# Patient Record
Sex: Female | Born: 1960 | Race: White | Hispanic: No | Marital: Married | Smoking: Never smoker
Health system: Southern US, Community
[De-identification: ages and names within clinical notes are randomized; demographics above are authoritative.]

## PROBLEM LIST (undated history)

## (undated) DIAGNOSIS — J45909 Unspecified asthma, uncomplicated: Secondary | ICD-10-CM

## (undated) DIAGNOSIS — M1990 Unspecified osteoarthritis, unspecified site: Secondary | ICD-10-CM

## (undated) DIAGNOSIS — M8580 Other specified disorders of bone density and structure, unspecified site: Secondary | ICD-10-CM

## (undated) DIAGNOSIS — R635 Abnormal weight gain: Secondary | ICD-10-CM

## (undated) DIAGNOSIS — E039 Hypothyroidism, unspecified: Secondary | ICD-10-CM

## (undated) DIAGNOSIS — M7989 Other specified soft tissue disorders: Secondary | ICD-10-CM

## (undated) DIAGNOSIS — J302 Other seasonal allergic rhinitis: Secondary | ICD-10-CM

## (undated) DIAGNOSIS — R600 Localized edema: Secondary | ICD-10-CM

## (undated) DIAGNOSIS — M549 Dorsalgia, unspecified: Secondary | ICD-10-CM

## (undated) DIAGNOSIS — E079 Disorder of thyroid, unspecified: Secondary | ICD-10-CM

## (undated) DIAGNOSIS — K219 Gastro-esophageal reflux disease without esophagitis: Secondary | ICD-10-CM

## (undated) DIAGNOSIS — R1310 Dysphagia, unspecified: Secondary | ICD-10-CM

## (undated) HISTORY — DX: Abnormal weight gain: R63.5

## (undated) HISTORY — DX: Other specified disorders of bone density and structure, unspecified site: M85.80

## (undated) HISTORY — DX: Dorsalgia, unspecified: M54.9

## (undated) HISTORY — PX: TONSILLECTOMY: SUR1361

## (undated) HISTORY — DX: Disorder of thyroid, unspecified: E07.9

## (undated) HISTORY — DX: Dysphagia, unspecified: R13.10

## (undated) HISTORY — DX: Localized edema: R60.0

## (undated) HISTORY — DX: Other specified soft tissue disorders: M79.89

## (undated) HISTORY — PX: OTHER SURGICAL HISTORY: SHX169

## (undated) HISTORY — PX: TENDON RECONSTRUCTION: SHX2487

---

## 2008-05-10 ENCOUNTER — Ambulatory Visit: Payer: Self-pay | Admitting: Sports Medicine

## 2008-05-10 ENCOUNTER — Ambulatory Visit: Payer: Self-pay | Admitting: Family Medicine

## 2008-05-10 DIAGNOSIS — Q666 Other congenital valgus deformities of feet: Secondary | ICD-10-CM | POA: Insufficient documentation

## 2008-05-10 DIAGNOSIS — M79609 Pain in unspecified limb: Secondary | ICD-10-CM | POA: Insufficient documentation

## 2008-05-10 DIAGNOSIS — S90129A Contusion of unspecified lesser toe(s) without damage to nail, initial encounter: Secondary | ICD-10-CM | POA: Insufficient documentation

## 2008-06-07 ENCOUNTER — Encounter: Payer: Self-pay | Admitting: Family Medicine

## 2010-10-02 ENCOUNTER — Encounter: Payer: Self-pay | Admitting: Family Medicine

## 2010-10-02 ENCOUNTER — Ambulatory Visit: Admission: RE | Admit: 2010-10-02 | Discharge: 2010-10-02 | Payer: Self-pay | Source: Home / Self Care

## 2010-10-02 DIAGNOSIS — R635 Abnormal weight gain: Secondary | ICD-10-CM | POA: Insufficient documentation

## 2010-10-02 DIAGNOSIS — E039 Hypothyroidism, unspecified: Secondary | ICD-10-CM | POA: Insufficient documentation

## 2010-10-02 LAB — CONVERTED CEMR LAB
ALT: 15 units/L (ref 0–35)
Albumin: 4.5 g/dL (ref 3.5–5.2)
CO2: 27 meq/L (ref 19–32)
Calcium: 9.2 mg/dL (ref 8.4–10.5)
Chloride: 102 meq/L (ref 96–112)
MCV: 91.5 fL (ref 78.0–100.0)
Platelets: 266 10*3/uL (ref 150–400)
Potassium: 4 meq/L (ref 3.5–5.3)
Sodium: 139 meq/L (ref 135–145)
T3, Free: 2.6 pg/mL (ref 2.3–4.2)
TSH: 3.228 microintl units/mL (ref 0.350–4.500)
Total Protein: 6.9 g/dL (ref 6.0–8.3)
WBC: 9.8 10*3/uL (ref 4.0–10.5)

## 2010-10-03 ENCOUNTER — Encounter: Payer: Self-pay | Admitting: Family Medicine

## 2010-10-03 LAB — CONVERTED CEMR LAB
Cholesterol: 214 mg/dL — ABNORMAL HIGH (ref 0–200)
VLDL: 17 mg/dL (ref 0–40)

## 2010-10-12 NOTE — Assessment & Plan Note (Signed)
Summary: thyroid followup   Vital Signs:  Patient profile:   50 year old female Height:      64 inches Weight:      156.5 pounds BMI:     26.96 Pulse rate:   71 / minute BP sitting:   118 / 77  (right arm)  Vitals Entered By: Arlyss Repress CMA, (October 02, 2010 1:22 PM) CC: discuss weight gain over last year and thyroid problems. pt takes levothyroxine x 2 years. feels like it is not the correct dose. Is Patient Diabetic? No Pain Assessment Patient in pain? no        CC:  discuss weight gain over last year and thyroid problems. pt takes levothyroxine x 2 years. feels like it is not the correct dose.Marland Kitchen  History of Present Illness: Doris Aguilar is here today to establish care at [facility] as a new patient.  She gives a history of the diagnosis of symptomatic hypothyroidism in 2007, with significant reponse to replacement.  At that time she also was making changes in her life syle, with increased exercise and she lost 60 pounds.  She felt well.  Her replacement dose was around 120 micrograms daily.  With a change in primary MD because of retirement, and subsequent blood work her replacement dose was decreased to 100 micrograms a year ago.  Over the past year she has gained 20 pounds despite aggressive exercises and healthy diet.  Additionally she reports consiptation, cold intolerance, cracking nails, dry hair and increased fatigue.  She is perimenopausal with scant infrequent menses and hot flashes.  She uses [employer] and finds that it reduces the hot flashes.    She reports lipid panel with high HDL, no known anemia, no episodes of tachycardia.   Habits & Providers  Alcohol-Tobacco-Diet     Tobacco Status: never  Current Medications (verified): 1)  Triamcinolone Acetonide 0.1 % Crea (Triamcinolone Acetonide) .... Apply To Rash As Directed, 80 Gm Tube 2)  Black Cohosh Hot Flash Relief 40 Mg Caps (Black Cohosh) 3)  Levothroid 100 Mcg Tabs (Levothyroxine Sodium) 4)  Fish Oil  1000 Mg Caps (Omega-3 Fatty Acids)  Allergies (verified): 1)  ! Penicillin V Potassium (Penicillin V Potassium)  Social History: Smoking Status:  never  Review of Systems       The patient complains of weight gain.  The patient denies anorexia, chest pain, dyspnea on exertion, and peripheral edema.   CV:  Complains of chest pain or discomfort and palpitations. Endo:  Complains of cold intolerance, heat intolerance, and weight change.  Physical Exam  General:  Well-developed,well-nourished,in no acute distress; alert,appropriate and cooperative throughout examination Neck:  No deformities, thyroidmegly, adenopathy, or tenderness noted. Heart:  normal rate, regular rhythm, and no murmur.   Skin:  turgor normal and color normal.  Nails: thumb nails dry with linear markings Cervical Nodes:  No lymphadenopathy noted Psych:  normally interactive, good eye contact, and not anxious appearing.     Impression & Recommendations:  Problem # 1:  HYPOTHYROIDISM (ICD-244.9) Recheck thyroid function, consider dosage increase if elevated or normal. Her updated medication list for this problem includes:    Levothroid 100 Mcg Tabs (Levothyroxine sodium)  Orders: CBC-[facility] (78295) Comp Met-[facility] ([id number])  Problem # 2:  WEIGHT GAIN (ICD-783.1) despite increase in physical activity and changes in diet, plan to see Dr. Gerilyn Pilgrim for counseling in near future  Complete Medication List: 1)  Triamcinolone Acetonide 0.1 % Crea (Triamcinolone acetonide) .... Apply to rash as directed,  80 gm tube 2)  Black Cohosh Hot Flash Relief 40 Mg Caps (Black cohosh) 3)  Levothroid 100 Mcg Tabs (Levothyroxine sodium) 4)  Fish Oil 1000 Mg Caps (Omega-3 fatty acids)  Other Orders: Lipid-[facility] ([id number]) Free T4-[facility] [phone]) Free T3-[facility] [phone])  Patient Instructions: 1)  Call with [phone] 2)  Carb reduction   Orders Added: 1)  Lipid-[facility] [80061-22930] 2)  Free T4-[facility] [44010-27253] 3)  Free  T3-[facility] [66440-34742] 4)  CBC-[facility] [85027] 5)  Comp Met-[facility] [80053-22900] 6)  [facility] [99203]

## 2010-10-12 NOTE — Miscellaneous (Signed)
  Clinical Lists Changes adjust up for high normal TSH, low normal active T3, and symptoms consistent with hypothyroidism. Medications: Changed medication from LEVOTHROID 100 MCG TABS (LEVOTHYROXINE SODIUM) to LEVOTHROID 125 MCG TABS (LEVOTHYROXINE SODIUM) one daily - Signed Rx of LEVOTHROID 125 MCG TABS (LEVOTHYROXINE SODIUM) one daily;  #31 x 3;  Signed;  Entered by: Luretha Murphy NP;  Authorized by: Luretha Murphy NP;  Method used: [facility] [phone]*, [address] [place], [place], [place]  [address], Ph: [phone] or [phone], Fax: [phone]    Prescriptions: LEVOTHROID 125 MCG TABS (LEVOTHYROXINE SODIUM) one daily  #31 x 3   Entered and Authorized by:   Luretha Murphy NP   Signed by:   Luretha Murphy NP on 10/03/2010   Method used:   [facility]ronically to        [facility] [phone]* (retail)       [address]       [place], [place]  [address]       Ph: [phone] or [phone]       Fax: [phone]   RxID:   [id number]

## 2010-12-25 ENCOUNTER — Other Ambulatory Visit: Payer: 59

## 2010-12-25 DIAGNOSIS — E039 Hypothyroidism, unspecified: Secondary | ICD-10-CM

## 2010-12-25 NOTE — Progress Notes (Signed)
TSH DONE TODAY MARCI HOLDER 

## 2010-12-26 ENCOUNTER — Other Ambulatory Visit: Payer: Self-pay | Admitting: Family Medicine

## 2010-12-26 DIAGNOSIS — E039 Hypothyroidism, unspecified: Secondary | ICD-10-CM

## 2010-12-27 ENCOUNTER — Other Ambulatory Visit: Payer: Self-pay | Admitting: Family Medicine

## 2010-12-27 ENCOUNTER — Encounter: Payer: Self-pay | Admitting: *Deleted

## 2010-12-27 DIAGNOSIS — Z1231 Encounter for screening mammogram for malignant neoplasm of breast: Secondary | ICD-10-CM

## 2010-12-27 MED ORDER — LEVOTHYROXINE SODIUM 112 MCG PO TABS: 112.0000 ug | ORAL_TABLET | Freq: Every day | ORAL | Status: DC

## 2010-12-27 NOTE — Progress Notes (Signed)
Called the breast center to schedule dexa and screening mammogram. Was told they have already called and left message for patient to return call so they can schedule appointments.Busick, Rodena Medin

## 2010-12-28 ENCOUNTER — Other Ambulatory Visit: Payer: Self-pay | Admitting: Family Medicine

## 2010-12-28 NOTE — Progress Notes (Signed)
Selected pharmacy.

## 2011-01-20 ENCOUNTER — Other Ambulatory Visit: Payer: Self-pay | Admitting: Family Medicine

## 2011-01-20 NOTE — Telephone Encounter (Signed)
Refill request

## 2011-02-07 ENCOUNTER — Other Ambulatory Visit: Payer: Self-pay | Admitting: Family Medicine

## 2011-02-07 DIAGNOSIS — E039 Hypothyroidism, unspecified: Secondary | ICD-10-CM

## 2011-02-08 NOTE — Telephone Encounter (Signed)
Refill request

## 2011-02-12 ENCOUNTER — Ambulatory Visit
Admission: RE | Admit: 2011-02-12 | Discharge: 2011-02-12 | Disposition: A | Discharge status: Home / Self Care | Payer: 59 | Source: Ambulatory Visit | Attending: Family Medicine | Admitting: Family Medicine

## 2011-02-12 ENCOUNTER — Encounter: Payer: 59 | Admitting: Family Medicine

## 2011-02-12 ENCOUNTER — Other Ambulatory Visit: Payer: Self-pay | Admitting: Family Medicine

## 2011-02-12 DIAGNOSIS — Z8262 Family history of osteoporosis: Secondary | ICD-10-CM

## 2011-02-12 DIAGNOSIS — M8580 Other specified disorders of bone density and structure, unspecified site: Secondary | ICD-10-CM

## 2011-02-12 DIAGNOSIS — E039 Hypothyroidism, unspecified: Secondary | ICD-10-CM

## 2011-02-12 DIAGNOSIS — Z0000 Encounter for general adult medical examination without abnormal findings: Secondary | ICD-10-CM

## 2011-02-12 DIAGNOSIS — Z1231 Encounter for screening mammogram for malignant neoplasm of breast: Secondary | ICD-10-CM

## 2011-02-12 HISTORY — DX: Other specified disorders of bone density and structure, unspecified site: M85.80

## 2011-02-13 ENCOUNTER — Other Ambulatory Visit: Payer: Self-pay | Admitting: Family Medicine

## 2011-02-13 ENCOUNTER — Encounter: Payer: Self-pay | Admitting: Family Medicine

## 2011-02-13 DIAGNOSIS — E038 Other specified hypothyroidism: Secondary | ICD-10-CM

## 2011-02-13 DIAGNOSIS — E039 Hypothyroidism, unspecified: Secondary | ICD-10-CM

## 2011-02-13 DIAGNOSIS — M8580 Other specified disorders of bone density and structure, unspecified site: Secondary | ICD-10-CM

## 2011-02-13 DIAGNOSIS — N951 Menopausal and female climacteric states: Secondary | ICD-10-CM

## 2011-02-13 MED ORDER — LEVOTHYROXINE SODIUM 112 MCG PO TABS: 112.0000 ug | ORAL_TABLET | Freq: Every day | ORAL | Status: DC

## 2011-02-16 ENCOUNTER — Encounter: Payer: Self-pay | Admitting: Family Medicine

## 2011-02-16 DIAGNOSIS — M8580 Other specified disorders of bone density and structure, unspecified site: Secondary | ICD-10-CM | POA: Insufficient documentation

## 2011-03-25 ENCOUNTER — Other Ambulatory Visit: Payer: Self-pay | Admitting: Family Medicine

## 2011-03-25 MED ORDER — NITROGLYCERIN 0.1 MG/HR TD PT24: MEDICATED_PATCH | TRANSDERMAL | Status: DC

## 2011-06-23 ENCOUNTER — Other Ambulatory Visit: Payer: Self-pay | Admitting: Family Medicine

## 2011-06-23 MED ORDER — FLUTICASONE PROPIONATE 50 MCG/ACT NA SUSP: 2.0000 | Freq: Every day | NASAL | Status: DC

## 2011-06-23 MED ORDER — FLUTICASONE-SALMETEROL 500-50 MCG/DOSE IN AEPB: 1.0000 | INHALATION_SPRAY | Freq: Two times a day (BID) | RESPIRATORY_TRACT | Status: DC

## 2011-12-03 ENCOUNTER — Encounter: Payer: Self-pay | Admitting: Family Medicine

## 2011-12-03 ENCOUNTER — Ambulatory Visit (INDEPENDENT_AMBULATORY_CARE_PROVIDER_SITE_OTHER): Payer: 59 | Admitting: Family Medicine

## 2011-12-03 VITALS — BP 117/78 | HR 59 | Ht 63.0 in | Wt 147.0 lb

## 2011-12-03 DIAGNOSIS — R635 Abnormal weight gain: Secondary | ICD-10-CM

## 2011-12-03 DIAGNOSIS — N951 Menopausal and female climacteric states: Secondary | ICD-10-CM

## 2011-12-03 DIAGNOSIS — Z780 Asymptomatic menopausal state: Secondary | ICD-10-CM

## 2011-12-03 DIAGNOSIS — Z299 Encounter for prophylactic measures, unspecified: Secondary | ICD-10-CM

## 2011-12-03 DIAGNOSIS — M8580 Other specified disorders of bone density and structure, unspecified site: Secondary | ICD-10-CM

## 2011-12-03 DIAGNOSIS — M949 Disorder of cartilage, unspecified: Secondary | ICD-10-CM

## 2011-12-03 DIAGNOSIS — E039 Hypothyroidism, unspecified: Secondary | ICD-10-CM

## 2011-12-03 DIAGNOSIS — M899 Disorder of bone, unspecified: Secondary | ICD-10-CM

## 2011-12-03 MED ORDER — GABAPENTIN 300 MG PO CAPS: ORAL_CAPSULE | ORAL | Status: DC

## 2011-12-03 NOTE — Patient Instructions (Signed)
Thanks for coming in today. We will set up the appointment for the colonoscopy. Try the gabapentin for the hot flashes.  If you are not feeling better in 2 weeks, let me know and we can try something else. I will let you know the results of your thyroid and electrolyte studies.

## 2011-12-04 ENCOUNTER — Encounter: Payer: Self-pay | Admitting: Family Medicine

## 2011-12-04 DIAGNOSIS — Z299 Encounter for prophylactic measures, unspecified: Secondary | ICD-10-CM | POA: Insufficient documentation

## 2011-12-04 LAB — BASIC METABOLIC PANEL
BUN: 24 mg/dL — ABNORMAL HIGH (ref 6–23)
Chloride: 103 mEq/L (ref 96–112)
Creat: 0.89 mg/dL (ref 0.50–1.10)
Glucose, Bld: 95 mg/dL (ref 70–99)

## 2011-12-04 NOTE — Assessment & Plan Note (Signed)
Dur for colonoscopy.  Will schedule.   Mammogram UTD.  Pap UTD (done 2011).   Due for Tdap.

## 2011-12-04 NOTE — Assessment & Plan Note (Signed)
Check BMP.  Encouraged to continue Weight Watchers and exercise regimen.

## 2011-12-04 NOTE — Progress Notes (Signed)
  Subjective:    Patient ID: Doris Aguilar, female    DOB: [birth year], 51 y.o.   MRN: [id number]  HPI 51 yo pt of Luretha Murphy her to meet new physician.Concerns: 1) Hot flashes.  LMP September, 2012.  Tried Black Cohosh without relief.  Feels sleep disturbance is biggest concern - woke up 10 times last night.  Hot flashes have worsened recently.  Does have in daytime as well, but worst at night.  Interested in meds, but has taken prozac in the past and did not like the way it made her feel, was on OCPs for about 5 years and feels better off of them.  Would like alternative meds. 2) Due for colonoscopy.   3) Has refills of thyroid medicine until June.  Feels ok on current dose.  Some weight issues, but doing [employer] and exercising daily.  (running, weight training, cross fit).  Felt better on higher dose, but given her TSH and concern of bone loss, will remain on current dose.   Non smoker Due for Tdap.    Review of Systems  See HPI     Objective:   Physical Exam  Nursing note and vitals reviewed. Constitutional: She appears well-developed and well-nourished. No distress.  Skin: She is not diaphoretic.  Psychiatric: She has a normal mood and affect. Her behavior is normal. Judgment and thought content normal.          Assessment & Plan:

## 2011-12-04 NOTE — Assessment & Plan Note (Signed)
Will check TSH today.  Plan to continue current dose unless TSH abnormal.

## 2011-12-04 NOTE — Assessment & Plan Note (Signed)
Reviewed Vit D and Ca recs.  Check Vit D level today.

## 2011-12-10 ENCOUNTER — Encounter: Payer: Self-pay | Admitting: Family Medicine

## 2012-01-14 ENCOUNTER — Other Ambulatory Visit: Payer: Self-pay | Admitting: Family Medicine

## 2012-09-29 ENCOUNTER — Other Ambulatory Visit: Payer: Self-pay | Admitting: Family Medicine

## 2012-09-29 MED ORDER — LEVOTHYROXINE SODIUM 112 MCG PO TABS: 112.0000 ug | ORAL_TABLET | Freq: Every day | ORAL | Status: DC

## 2013-01-05 ENCOUNTER — Other Ambulatory Visit: Payer: Self-pay | Admitting: *Deleted

## 2013-01-05 MED ORDER — LEVOTHYROXINE SODIUM 112 MCG PO TABS: 112.0000 ug | ORAL_TABLET | Freq: Every day | ORAL | Status: DC

## 2013-02-02 ENCOUNTER — Other Ambulatory Visit: Payer: Self-pay | Admitting: Family Medicine

## 2013-02-02 MED ORDER — VALACYCLOVIR HCL 500 MG PO TABS: ORAL_TABLET | ORAL | Status: DC

## 2013-02-02 NOTE — Progress Notes (Signed)
Breaking out in right upper back and right chest burning pain---radiates to nipple. Similar to episode of shingles on her buttoks several years ago. Has been under a lot of stress. Only one small area of "rash" oon rigt scapular area. No fevers. Will start valtrex empirially as she is out of town.

## 2013-03-31 ENCOUNTER — Other Ambulatory Visit: Payer: Self-pay | Admitting: Family Medicine

## 2013-04-01 NOTE — Telephone Encounter (Signed)
Patient has not been seen in over 1 yr,need appointment for follow up with her hypothyroidism before medication refill can be done.

## 2013-04-02 NOTE — Telephone Encounter (Signed)
Please have patient schedule follow up appointment with me soon. Thank you.

## 2013-04-02 NOTE — Telephone Encounter (Signed)
[facility]roid per Dr. Lum Babe.  Pt needs an appt.  Gabe Glace, Darlyne Russian, CMA

## 2013-04-03 ENCOUNTER — Other Ambulatory Visit: Payer: Self-pay | Admitting: Family Medicine

## 2013-04-03 MED ORDER — LEVOTHYROXINE SODIUM 112 MCG PO TABS: 112.0000 ug | ORAL_TABLET | Freq: Every day | ORAL | Status: DC

## 2013-05-01 ENCOUNTER — Encounter: Payer: Self-pay | Admitting: Family Medicine

## 2013-05-01 ENCOUNTER — Ambulatory Visit (INDEPENDENT_AMBULATORY_CARE_PROVIDER_SITE_OTHER): Payer: 59 | Admitting: Family Medicine

## 2013-05-01 VITALS — BP 112/78 | HR 67 | Temp 98.1°F | Ht 64.0 in | Wt 154.0 lb

## 2013-05-01 DIAGNOSIS — E039 Hypothyroidism, unspecified: Secondary | ICD-10-CM

## 2013-05-01 DIAGNOSIS — Z1231 Encounter for screening mammogram for malignant neoplasm of breast: Secondary | ICD-10-CM | POA: Insufficient documentation

## 2013-05-01 DIAGNOSIS — Z0000 Encounter for general adult medical examination without abnormal findings: Secondary | ICD-10-CM

## 2013-05-01 LAB — LDL CHOLESTEROL, DIRECT: Direct LDL: 130 mg/dL — ABNORMAL HIGH

## 2013-05-01 LAB — BASIC METABOLIC PANEL
BUN: 19 mg/dL (ref 6–23)
Potassium: 4.2 mEq/L (ref 3.5–5.3)
Sodium: 138 mEq/L (ref 135–145)

## 2013-05-01 LAB — POCT GLYCOSYLATED HEMOGLOBIN (HGB A1C): Hemoglobin A1C: 5.3

## 2013-05-01 LAB — TSH: TSH: 0.414 u[IU]/mL (ref 0.350–4.500)

## 2013-05-01 NOTE — Assessment & Plan Note (Signed)
Pt w/ no complaints other than hypothyroidism.  Will check direct LDL, A1C, and BMET today.

## 2013-05-01 NOTE — Patient Instructions (Signed)
Doris Aguilar, it was nice meeting you today.  We will check some tests on you including an LDL, A1C for diabetes screening, BMET for electrolytes, and TSH for you thyroid.  We will give you a call in regards to your TSH and thyroid medication and either call you or send you a letter for your other tests.  If you have any questions, please don't hesitate to call.  Thanks, [name]. Heywood Tokunaga

## 2013-05-01 NOTE — Progress Notes (Signed)
Doris Aguilar is a 52 y.o. female who presents today for well check as well as f/u for her hypothyroidism.  Well check - Pt w/ no complaints other than her increased fatigue, intolerance to cold, and recent weight gain of 5-10 lbs over the last 6 months.  She denies any fevers, chills, night sweats, N/V/D, HA, blurred vision, diplopia, tinnitus, dysphagia, CP, SOB, abdominal discomfort, hematuria, hematochezia, melena, diarrhea, constipation, lower extremity edema, numbness, tingling or burning in extremities.    Hypothyroidism - Pt compliant with her synthroid 112 mcg per day and last TSH 1.4 in March 2013.  She has had ongoing fatigue/intolerance to cold for last 6-12 months along with some weight gain of 5-10 lbs for around 6 months.  No recent travel, illness, hospitalization and denies dysphagia, bony pain, changes in mood, constipation, or nephrolithiasis or hematuria.  Previous TSH in March 2013 had initial increase in her synthroid to 125 mcg qd with decreased fatigue/cold intolerance but due to concern for osteoporosis was cut back to 112 mcg qd.    Past Medical History  Diagnosis Date  . Osteopenia 02/12/11    T score -1.7 of femoral neck, 0.0 of LS spine    History  Smoking status  . Never Smoker   Smokeless tobacco  . Never Used    Family History  Problem Relation Age of Onset  . Osteoporosis Mother   . Stroke Mother   . Hypertension Mother   . Hyperlipidemia Mother   . Heart disease Father 67    died MI    Current Outpatient Prescriptions on File Prior to Visit  Medication Sig Dispense Refill  . fluticasone (FLONASE) 50 MCG/ACT nasal spray Place 2 sprays into the nose daily.  16 g  12  . Fluticasone-Salmeterol (ADVAIR DISKUS) 500-50 MCG/DOSE AEPB Inhale 1 puff into the lungs 2 (two) times daily.  1 each  12  . levothyroxine (SYNTHROID, LEVOTHROID) 112 MCG tablet Take 1 tablet (112 mcg total) by mouth daily.  30 tablet  0  . nitroGLYCERIN (NITRODUR - DOSED IN MG/24 HR)  0.1 mg/hr Cut patch into one - fourth pieces. Place a one fourth piece of patch on  skin over affected area( heel), changing to a new piece every 24 hours.   30 patch  2  . valACYclovir (VALTREX) 500 MG tablet Take two tablets every 8 hours by mouth for seven days  42 tablet  1   No current facility-administered medications on file prior to visit.    ROS: Per HPI.  All other systems reviewed and are negative.   Physical Exam Filed Vitals:   05/01/13 0851  BP: 112/78  Pulse: 67  Temp: 98.1 F (36.7 C)    Physical Examination: General appearance - alert, well appearing, and in no distress Neck - thyroid exam: thyroid is normal in size without nodules or tenderness Chest - clear to auscultation, no wheezes, rales or rhonchi, symmetric air entry   Lab Results  Component Value Date   TSH 1.403 12/03/2011

## 2013-05-01 NOTE — Assessment & Plan Note (Signed)
Pt having some fatigue and recent weight gain with intolerance to cold.  Current dose 112 mcg and will check TSH today.  Pending results, if borderline below 6, will consider increasing to 125 and if higher will go ahead and increase to this.  However, have to balance her risk for osteopenia/porosis.

## 2013-05-01 NOTE — Assessment & Plan Note (Signed)
Pt last mammogram over 2+ yrs ago.  Since she is over 50, and has been more than two yrs will go ahead and put in referral for mammogram.  No FHx of Breast CA and no palpable lumps per report of the patient.

## 2013-05-06 ENCOUNTER — Encounter: Payer: Self-pay | Admitting: Family Medicine

## 2013-05-06 ENCOUNTER — Telehealth: Payer: Self-pay | Admitting: Family Medicine

## 2013-05-06 MED ORDER — LEVOTHYROXINE SODIUM 112 MCG PO TABS: 112.0000 ug | ORAL_TABLET | Freq: Every day | ORAL | Status: DC

## 2013-05-06 NOTE — Telephone Encounter (Signed)
I called pt with the results of her laboratory tests.  She understands the results and would not like to start anything for her cholesterol, but will change her diet.  We will see her back every 6 months or PRN.  Synthroid refilled as well.   Doris Aguilar Doris Fusi, DO of [facility] 05/06/2013, 4:01 PM

## 2013-06-15 ENCOUNTER — Other Ambulatory Visit: Payer: Self-pay | Admitting: Family Medicine

## 2013-06-15 DIAGNOSIS — M84375A Stress fracture, left foot, initial encounter for fracture: Secondary | ICD-10-CM

## 2013-06-26 NOTE — Progress Notes (Signed)
Lateral foot pain for several months, suddenly much worse after [place]. Difficulty bearing weight ion it yesterday and today. FOOT: ttp lateral area, esp over cuboid dorsally.  [place]: increased doppler activity cuboid / 5th proximal MT.  A/P: underlying cuboid pathology likely with apparent stress fx 5th MT. Cam walker boot w orthotic (added cuboid pad) In

## 2013-07-16 ENCOUNTER — Other Ambulatory Visit: Payer: Self-pay

## 2013-10-06 ENCOUNTER — Telehealth: Payer: Self-pay | Admitting: *Deleted

## 2013-10-06 DIAGNOSIS — M79672 Pain in left foot: Secondary | ICD-10-CM

## 2013-10-07 NOTE — Telephone Encounter (Signed)
MRI scheduled for 10/14/13 at 5 pm, arrive at 4:45 pm at [facility] 1st floor radiology.

## 2013-10-14 ENCOUNTER — Ambulatory Visit (HOSPITAL_COMMUNITY)
Admission: RE | Admit: 2013-10-14 | Discharge: 2013-10-14 | Disposition: A | Discharge status: Home / Self Care | Payer: 59 | Source: Ambulatory Visit | Attending: Sports Medicine | Admitting: Sports Medicine

## 2013-10-14 DIAGNOSIS — M629 Disorder of muscle, unspecified: Secondary | ICD-10-CM | POA: Insufficient documentation

## 2013-10-14 DIAGNOSIS — M2420 Disorder of ligament, unspecified site: Secondary | ICD-10-CM | POA: Insufficient documentation

## 2013-10-14 DIAGNOSIS — M79672 Pain in left foot: Secondary | ICD-10-CM

## 2013-10-14 DIAGNOSIS — M79609 Pain in unspecified limb: Secondary | ICD-10-CM | POA: Insufficient documentation

## 2014-04-30 ENCOUNTER — Encounter: Payer: Self-pay | Admitting: Internal Medicine

## 2014-06-11 ENCOUNTER — Ambulatory Visit (AMBULATORY_SURGERY_CENTER): Payer: Self-pay

## 2014-06-11 VITALS — Ht 63.0 in | Wt 164.0 lb

## 2014-06-11 DIAGNOSIS — Z1211 Encounter for screening for malignant neoplasm of colon: Secondary | ICD-10-CM

## 2014-06-11 MED ORDER — MOVIPREP 100 G PO SOLR: 1.0000 | Freq: Once | ORAL | Status: DC

## 2014-06-11 NOTE — Progress Notes (Signed)
No allergies to eggs or soy No problems with anesthesia No home oxygen No diet/weight loss meds  Has email  Emmi instructions given for colonoscopy 

## 2014-06-25 ENCOUNTER — Ambulatory Visit (AMBULATORY_SURGERY_CENTER): Payer: 59 | Admitting: Internal Medicine

## 2014-06-25 ENCOUNTER — Encounter: Payer: Self-pay | Admitting: Internal Medicine

## 2014-06-25 VITALS — BP 121/73 | HR 52 | Temp 97.5°F | Resp 12 | Ht 63.0 in | Wt 164.0 lb

## 2014-06-25 DIAGNOSIS — Z1211 Encounter for screening for malignant neoplasm of colon: Secondary | ICD-10-CM

## 2014-06-25 MED ORDER — SODIUM CHLORIDE 0.9 % IV SOLN: 500.0000 mL | INTRAVENOUS | Status: DC

## 2014-06-25 NOTE — Progress Notes (Signed)
Procedure ends, to recovery, report given and VSS. 

## 2014-06-25 NOTE — Progress Notes (Signed)
Pt co of iv site burning after IV was taken out, reassessed IV site, area over soft part of thumb had a little edema and was cool to touch, gave pt warm compress and instructed if any redness or severe pain to call office, numbers were given on discharge instructions-adm

## 2014-06-25 NOTE — Op Note (Signed)
[facility]  [address] [place], [address]   COLONOSCOPY PROCEDURE REPORT  PATIENT: Doris Aguilar, Doris Aguilar  MR#: [id number] BIRTHDATE: Oct 07, 1960 , 77  yrs. old GENDER: female ENDOSCOPIST: Jerene Bears, MD REFERRED BY: Melrose Nakayama, MD PROCEDURE DATE:  06/25/2014 PROCEDURE:   Colonoscopy, screening First Screening Colonoscopy - Avg.  risk and is 50 yrs.  old or older Yes.  Prior Negative Screening - Now for repeat screening. N/A  History of Adenoma - Now for follow-up colonoscopy & has been > or = to 3 yrs.  N/A  Polyps Removed Today? Yes. ASA CLASS:   Class II INDICATIONS:average risk for colon cancer and first colonoscopy. MEDICATIONS: Monitored anesthesia care and Propofol 250 mg IV  DESCRIPTION OF PROCEDURE:   After the risks benefits and alternatives of the procedure were thoroughly explained, informed consent was obtained.  The digital rectal exam revealed no abnormalities of the rectum.   The LB [id number]  endoscope was introduced through the anus and advanced to the cecum, which was identified by both the appendix and ileocecal valve. No adverse events experienced.   The quality of the prep was good, using MoviPrep  The instrument was then slowly withdrawn as the colon was fully examined.     COLON FINDINGS: A normal appearing cecum, ileocecal valve, and appendiceal orifice were identified.  The ascending, transverse, descending, sigmoid colon, and rectum appeared unremarkable. Retroflexed views revealed internal hemorrhoids. The time to cecum=4 minutes 30 seconds.  Withdrawal time=10 minutes 46 seconds. The scope was withdrawn and the procedure completed.  COMPLICATIONS: There were no immediate complications.  ENDOSCOPIC IMPRESSION: Normal colonoscopy  RECOMMENDATIONS: You should continue to follow colorectal cancer screening guidelines for "routine risk" patients with a repeat colonoscopy in 10 years. There is no need for FOBT (stool)  testing for at least 5 years.  eSigned:  Jerene Bears, MD 06/25/2014 2:00 PM   cc: The Patient; Melrose Nakayama, MD

## 2014-06-25 NOTE — Patient Instructions (Signed)
YOU HAD AN ENDOSCOPIC PROCEDURE TODAY AT THE [facility]: Refer to the procedure report that was given to you for any specific questions about what was found during the examination.  If the procedure report does not answer your questions, please call your gastroenterologist to clarify.  If you requested that your care partner not be given the details of your procedure findings, then the procedure report has been included in a sealed envelope for you to review at your convenience later.  YOU SHOULD EXPECT: Some feelings of bloating in the abdomen. Passage of more gas than usual.  Walking can help get rid of the air that was put into your GI tract during the procedure and reduce the bloating. If you had a lower endoscopy (such as a colonoscopy or flexible sigmoidoscopy) you may notice spotting of blood in your stool or on the toilet paper. If you underwent a bowel prep for your procedure, then you may not have a normal bowel movement for a few days.  DIET: Your first meal following the procedure should be a light meal and then it is ok to progress to your normal diet.  A half-sandwich or bowl of soup is an example of a good first meal.  Heavy or fried foods are harder to digest and may make you feel nauseous or bloated.  Likewise meals heavy in dairy and vegetables can cause extra gas to form and this can also increase the bloating.  Drink plenty of fluids but you should avoid alcoholic beverages for 24 hours.  ACTIVITY: Your care partner should take you home directly after the procedure.  You should plan to take it easy, moving slowly for the rest of the day.  You can resume normal activity the day after the procedure however you should NOT DRIVE or use heavy machinery for 24 hours (because of the sedation medicines used during the test).    SYMPTOMS TO REPORT IMMEDIATELY: A gastroenterologist can be reached at any hour.  During normal business hours, 8:30 AM to 5:00 PM Monday through Friday,  call [phone].  After hours and on weekends, please call the GI answering service at [phone] who will take a message and have the physician on call contact you.   Following lower endoscopy (colonoscopy or flexible sigmoidoscopy):  Excessive amounts of blood in the stool  Significant tenderness or worsening of abdominal pains  Swelling of the abdomen that is new, acute  Fever of 100F or higher  FOLLOW UP: If any biopsies were taken you will be contacted by phone or by letter within the next 1-3 weeks.  Call your gastroenterologist if you have not heard about the biopsies in 3 weeks.  Our staff will call the home number listed on your records the next business day following your procedure to check on you and address any questions or concerns that you may have at that time regarding the information given to you following your procedure. This is a courtesy call and so if there is no answer at the home number and we have not heard from you through the emergency physician on call, we will assume that you have returned to your regular daily activities without incident.  SIGNATURES/CONFIDENTIALITY: You and/or your care partner have signed paperwork which will be entered into your electronic medical record.  These signatures attest to the fact that that the information above on your After Visit Summary has been reviewed and is understood.  Full responsibility of the confidentiality of this   discharge information lies with you and/or your care-partner.  Normal colonoscopy.  Repeat colonoscopy in 10 yr-2025

## 2014-06-28 ENCOUNTER — Telehealth: Payer: Self-pay | Admitting: *Deleted

## 2014-06-28 NOTE — Telephone Encounter (Signed)
Message left

## 2014-08-05 ENCOUNTER — Other Ambulatory Visit: Payer: Self-pay | Admitting: Family Medicine

## 2014-08-05 MED ORDER — FLUTICASONE-SALMETEROL 500-50 MCG/DOSE IN AEPB: 1.0000 | INHALATION_SPRAY | Freq: Two times a day (BID) | RESPIRATORY_TRACT | Status: DC

## 2014-08-10 ENCOUNTER — Other Ambulatory Visit: Payer: Self-pay | Admitting: *Deleted

## 2014-08-10 MED ORDER — FLUTICASONE-SALMETEROL 500-50 MCG/DOSE IN AEPB: 1.0000 | INHALATION_SPRAY | Freq: Two times a day (BID) | RESPIRATORY_TRACT | Status: DC

## 2014-08-10 NOTE — Telephone Encounter (Signed)
Pt need this medication sent to [facility].  Derl Barrow, RN

## 2014-08-26 ENCOUNTER — Ambulatory Visit (INDEPENDENT_AMBULATORY_CARE_PROVIDER_SITE_OTHER): Payer: 59 | Admitting: *Deleted

## 2014-08-26 DIAGNOSIS — Z23 Encounter for immunization: Secondary | ICD-10-CM

## 2015-01-26 ENCOUNTER — Other Ambulatory Visit: Payer: Self-pay | Admitting: Family Medicine

## 2015-01-26 MED ORDER — TRIAMCINOLONE ACETONIDE 0.1 % EX CREA: 1.0000 "application " | TOPICAL_CREAM | Freq: Two times a day (BID) | CUTANEOUS | Status: DC

## 2015-01-26 MED ORDER — PREDNISONE 10 MG PO TABS: ORAL_TABLET | ORAL | Status: DC

## 2015-01-26 NOTE — Progress Notes (Signed)
Poison ivey all over left face, eyelid, neck and bilateral hands and arms. Supposed to go out of town today. Otherwise feeling OK. No visual changes. Sclera is normal in appearance. Left eyelid slightly swollen but otherwise normal eye exam w EOMI and painless. No OP swelling, no wheezes. No fever. Will do steroid taper. F/u PCP if new or worsening sx

## 2015-05-09 ENCOUNTER — Encounter: Payer: Self-pay | Admitting: Family Medicine

## 2015-05-11 ENCOUNTER — Other Ambulatory Visit: Payer: Self-pay | Admitting: Orthopedic Surgery

## 2015-05-12 NOTE — Pre-Procedure Instructions (Signed)
Doris Aguilar  05/12/2015       OUTPATIENT PHARMACY - [place], [place] - [address] [place] [address] Phone: [phone] Fax: [place], [place] [address] Phone: [phone] Fax: [phone]    Your procedure is scheduled on Saturday, September 3rd, 2016.  Marland Kitchen  Report to [facility] Admitting at 6:00 A.M.  Call this number if you have problems the morning of surgery:  [phone]   Remember:  Do not eat food or drink liquids after midnight.  TONIGHT   Take these medicines the morning of surgery with A SIP OF WATER:  Levothyroxine (Synthroid),    Stop taking: NSAIDS, Aspirin, Aleve, Naproxen, Ibuprofen, Motrin, Advil, Fish Oil, all herbal medications, and all vitamins.    Do not wear jewelry, make-up or nail polish.  Do not wear lotions, powders, or perfumes.  You may NOT wear deodorant.  Do not shave 48 hours prior to surgery.    Do not bring valuables to the hospital.  [facility] is not responsible for any belongings or valuables.  Contacts, dentures or bridgework may not be worn into surgery.  Leave your suitcase in the car.  After surgery it may be brought to your room.  For patients admitted to the hospital, discharge time will be determined by your treatment team.  Patients discharged the day of surgery will not be allowed to drive home.   Special instructions:  Special Instructions: [facility] - Preparing for Surgery  Before surgery, you can play an important role.  Because skin is not sterile, your skin needs to be as free of germs as possible.  You can reduce the number of germs on you skin by washing with CHG (chlorahexidine gluconate) soap before surgery.  CHG is an antiseptic cleaner which kills germs and bonds with the skin to continue killing germs even after washing.  Please DO NOT use if you  have an allergy to CHG or antibacterial soaps.  If your skin becomes reddened/irritated stop using the CHG and inform your nurse when you arrive at Short Stay.  Do not shave (including legs and underarms) for at least 48 hours prior to the first CHG shower.  You may shave your face.  Please follow these instructions carefully:   1.  Shower with CHG Soap the night before surgery and the  morning of Surgery.  2.  If you choose to wash your hair, wash your hair first as usual with your  normal shampoo.  3.  After you shampoo, rinse your hair and body thoroughly to remove the  Shampoo.  4.  Use CHG as you would any other liquid soap.  You can apply chg directly to the skin and wash gently with scrungie or a clean washcloth.  5.  Apply the CHG Soap to your body ONLY FROM THE NECK DOWN.    Do not use on open wounds or open sores.  Avoid contact with your eyes, ears, mouth and genitals (private parts).  Wash genitals (private parts)   with your normal soap.  6.  Wash thoroughly, paying special attention to the area where your surgery will be performed.  7.  Thoroughly rinse your body with warm water from the neck down.  8.  DO NOT shower/wash with your normal soap after using and rinsing off   the CHG Soap.  9.  Pat yourself dry with a clean towel.            10.  Wear clean pajamas.            11.  Place clean sheets on your bed the night of your first shower and do not sleep with pets.  Day of Surgery  Do not apply any lotions/deodorants the morning of surgery.  Please wear clean clothes to the hospital/surgery center.  Please read over the following fact sheets that you were given. Pain Booklet, Coughing and Deep Breathing, MRSA Information and Surgical Site Infection Prevention

## 2015-05-13 ENCOUNTER — Encounter (HOSPITAL_COMMUNITY)
Admission: RE | Admit: 2015-05-13 | Discharge: 2015-05-13 | Disposition: A | Discharge status: Home / Self Care | Payer: 59 | Source: Ambulatory Visit | Attending: Orthopedic Surgery | Admitting: Orthopedic Surgery

## 2015-05-13 ENCOUNTER — Encounter (HOSPITAL_COMMUNITY): Payer: Self-pay

## 2015-05-13 DIAGNOSIS — X58XXXA Exposure to other specified factors, initial encounter: Secondary | ICD-10-CM | POA: Diagnosis not present

## 2015-05-13 DIAGNOSIS — E039 Hypothyroidism, unspecified: Secondary | ICD-10-CM | POA: Diagnosis not present

## 2015-05-13 DIAGNOSIS — Z880 Allergy status to penicillin: Secondary | ICD-10-CM | POA: Diagnosis not present

## 2015-05-13 DIAGNOSIS — S62512A Displaced fracture of proximal phalanx of left thumb, initial encounter for closed fracture: Secondary | ICD-10-CM | POA: Diagnosis present

## 2015-05-13 HISTORY — DX: Hypothyroidism, unspecified: E03.9

## 2015-05-13 HISTORY — DX: Unspecified osteoarthritis, unspecified site: M19.90

## 2015-05-13 HISTORY — DX: Other seasonal allergic rhinitis: J30.2

## 2015-05-13 HISTORY — DX: Gastro-esophageal reflux disease without esophagitis: K21.9

## 2015-05-13 LAB — BASIC METABOLIC PANEL
Anion gap: 9 (ref 5–15)
BUN: 25 mg/dL — AB (ref 6–20)
CHLORIDE: 103 mmol/L (ref 101–111)
CO2: 25 mmol/L (ref 22–32)
CREATININE: 0.85 mg/dL (ref 0.44–1.00)
Calcium: 9.3 mg/dL (ref 8.9–10.3)
GFR calc Af Amer: >60 mL/min (ref >60)
GFR calc non Af Amer: >60 mL/min (ref >60)
GLUCOSE: 97 mg/dL (ref 65–99)
Potassium: 4.2 mmol/L (ref 3.5–5.1)
SODIUM: 137 mmol/L (ref 135–145)

## 2015-05-13 LAB — CBC
HEMATOCRIT: 44.7 % (ref 36.0–46.0)
Hemoglobin: 15.3 g/dL — ABNORMAL HIGH (ref 12.0–15.0)
MCH: 29.6 pg (ref 26.0–34.0)
MCHC: 34.2 g/dL (ref 30.0–36.0)
MCV: 86.5 fL (ref 78.0–100.0)
PLATELETS: 269 10*3/uL (ref 150–400)
RBC: 5.17 MIL/uL — ABNORMAL HIGH (ref 3.87–5.11)
RDW: 13 % (ref 11.5–15.5)
WBC: 7.9 10*3/uL (ref 4.0–10.5)

## 2015-05-13 NOTE — Progress Notes (Signed)
Pt. Denies all chest, heart related symptoms, pt. Followed by [facility]. Primary care: Melrose Nakayama, MD in [place], [place]. Pt. Doesn't recall ever having any cardiac testing or concerns for need of them.

## 2015-05-14 ENCOUNTER — Ambulatory Visit (HOSPITAL_COMMUNITY)
Admission: RE | Admit: 2015-05-14 | Discharge: 2015-05-14 | Disposition: A | Discharge status: Home / Self Care | Payer: 59 | Source: Ambulatory Visit | Attending: Orthopedic Surgery | Admitting: Orthopedic Surgery

## 2015-05-14 ENCOUNTER — Ambulatory Visit (HOSPITAL_COMMUNITY): Payer: 59 | Admitting: Anesthesiology

## 2015-05-14 ENCOUNTER — Encounter (HOSPITAL_COMMUNITY)
Admission: RE | Disposition: A | Discharge status: Home / Self Care | Payer: Self-pay | Source: Ambulatory Visit | Attending: Orthopedic Surgery

## 2015-05-14 ENCOUNTER — Encounter (HOSPITAL_COMMUNITY): Payer: Self-pay | Admitting: *Deleted

## 2015-05-14 DIAGNOSIS — X58XXXA Exposure to other specified factors, initial encounter: Secondary | ICD-10-CM | POA: Insufficient documentation

## 2015-05-14 DIAGNOSIS — S62512A Displaced fracture of proximal phalanx of left thumb, initial encounter for closed fracture: Secondary | ICD-10-CM | POA: Insufficient documentation

## 2015-05-14 DIAGNOSIS — Z880 Allergy status to penicillin: Secondary | ICD-10-CM | POA: Insufficient documentation

## 2015-05-14 DIAGNOSIS — E039 Hypothyroidism, unspecified: Secondary | ICD-10-CM | POA: Insufficient documentation

## 2015-05-14 HISTORY — PX: FINGER CLOSED REDUCTION: SHX1633

## 2015-05-14 SURGERY — CLOSED REDUCTION, FRACTURE, METACARPAL BONE
Anesthesia: General | Site: Thumb | Laterality: Left

## 2015-05-14 MED ORDER — VANCOMYCIN HCL IN DEXTROSE 1-5 GM/200ML-% IV SOLN
1000.0000 mg | INTRAVENOUS | Status: AC
  Administered 2015-05-14: 1000 mg via INTRAVENOUS

## 2015-05-14 MED ORDER — VANCOMYCIN HCL IN DEXTROSE 1-5 GM/200ML-% IV SOLN
INTRAVENOUS | Status: AC
  Filled 2015-05-14: qty 200

## 2015-05-14 MED ORDER — OXYCODONE HCL 5 MG/5ML PO SOLN: 5.0000 mg | Freq: Once | ORAL | Status: DC | PRN

## 2015-05-14 MED ORDER — CHLORHEXIDINE GLUCONATE 4 % EX LIQD: 60.0000 mL | Freq: Once | CUTANEOUS | Status: DC

## 2015-05-14 MED ORDER — MIDAZOLAM HCL 2 MG/2ML IJ SOLN
INTRAMUSCULAR | Status: AC
  Filled 2015-05-14: qty 4

## 2015-05-14 MED ORDER — MIDAZOLAM HCL 5 MG/5ML IJ SOLN
INTRAMUSCULAR | Status: DC | PRN
  Administered 2015-05-14: 2 mg via INTRAVENOUS

## 2015-05-14 MED ORDER — 0.9 % SODIUM CHLORIDE (POUR BTL) OPTIME
TOPICAL | Status: DC | PRN
  Administered 2015-05-14: 1000 mL

## 2015-05-14 MED ORDER — HYDROMORPHONE HCL 1 MG/ML IJ SOLN
0.2500 mg | INTRAMUSCULAR | Status: DC | PRN
  Administered 2015-05-14 (×2): 0.5 mg via INTRAVENOUS

## 2015-05-14 MED ORDER — SODIUM CHLORIDE 0.45 % IV SOLN: INTRAVENOUS | Status: DC

## 2015-05-14 MED ORDER — HYDROMORPHONE HCL 1 MG/ML IJ SOLN
INTRAMUSCULAR | Status: AC
  Filled 2015-05-14: qty 1

## 2015-05-14 MED ORDER — LIDOCAINE HCL (CARDIAC) 20 MG/ML IV SOLN
INTRAVENOUS | Status: DC | PRN
  Administered 2015-05-14: 50 mg via INTRAVENOUS

## 2015-05-14 MED ORDER — EPHEDRINE SULFATE 50 MG/ML IJ SOLN
INTRAMUSCULAR | Status: DC | PRN
  Administered 2015-05-14: 10 mg via INTRAVENOUS

## 2015-05-14 MED ORDER — DEXAMETHASONE SODIUM PHOSPHATE 4 MG/ML IJ SOLN
INTRAMUSCULAR | Status: DC | PRN
  Administered 2015-05-14: 4 mg via INTRAVENOUS

## 2015-05-14 MED ORDER — FENTANYL CITRATE (PF) 250 MCG/5ML IJ SOLN
INTRAMUSCULAR | Status: AC
  Filled 2015-05-14: qty 5

## 2015-05-14 MED ORDER — PROMETHAZINE HCL 25 MG/ML IJ SOLN: 6.2500 mg | INTRAMUSCULAR | Status: DC | PRN

## 2015-05-14 MED ORDER — PROPOFOL 10 MG/ML IV BOLUS
INTRAVENOUS | Status: DC | PRN
  Administered 2015-05-14: 150 mg via INTRAVENOUS

## 2015-05-14 MED ORDER — BUPIVACAINE HCL (PF) 0.25 % IJ SOLN
INTRAMUSCULAR | Status: DC | PRN
  Administered 2015-05-14: 10 mL

## 2015-05-14 MED ORDER — PROPOFOL 10 MG/ML IV BOLUS
INTRAVENOUS | Status: AC
  Filled 2015-05-14: qty 20

## 2015-05-14 MED ORDER — OXYCODONE HCL 5 MG PO TABS: 10.0000 mg | ORAL_TABLET | ORAL | Status: DC | PRN

## 2015-05-14 MED ORDER — SULFAMETHOXAZOLE-TRIMETHOPRIM 800-160 MG PO TABS: 1.0000 | ORAL_TABLET | Freq: Two times a day (BID) | ORAL | Status: DC

## 2015-05-14 MED ORDER — ONDANSETRON HCL 4 MG/2ML IJ SOLN
INTRAMUSCULAR | Status: DC | PRN
  Administered 2015-05-14: 4 mg via INTRAVENOUS

## 2015-05-14 MED ORDER — BUPIVACAINE HCL (PF) 0.25 % IJ SOLN
INTRAMUSCULAR | Status: AC
  Filled 2015-05-14: qty 30

## 2015-05-14 MED ORDER — OXYCODONE HCL 5 MG PO TABS: 5.0000 mg | ORAL_TABLET | Freq: Once | ORAL | Status: DC | PRN

## 2015-05-14 MED ORDER — FENTANYL CITRATE (PF) 100 MCG/2ML IJ SOLN
INTRAMUSCULAR | Status: DC | PRN
  Administered 2015-05-14 (×2): 25 ug via INTRAVENOUS
  Administered 2015-05-14: 50 ug via INTRAVENOUS

## 2015-05-14 MED ORDER — LACTATED RINGERS IV SOLN
INTRAVENOUS | Status: DC | PRN
  Administered 2015-05-14: 08:00:00 via INTRAVENOUS

## 2015-05-14 SURGICAL SUPPLY — 52 items
BANDAGE ELASTIC 3 VELCRO ST LF (GAUZE/BANDAGES/DRESSINGS) ×2 IMPLANT
BANDAGE ELASTIC 4 VELCRO ST LF (GAUZE/BANDAGES/DRESSINGS) ×2 IMPLANT
BLADE SURG ROTATE 9660 (MISCELLANEOUS) IMPLANT
BNDG CMPR 9X4 STRL LF SNTH (GAUZE/BANDAGES/DRESSINGS) ×1
BNDG ESMARK 4X9 LF (GAUZE/BANDAGES/DRESSINGS) ×2 IMPLANT
BNDG GAUZE ELAST 4 BULKY (GAUZE/BANDAGES/DRESSINGS) ×4 IMPLANT
CORDS BIPOLAR (ELECTRODE) ×2 IMPLANT
COVER SURGICAL LIGHT HANDLE (MISCELLANEOUS) ×2 IMPLANT
CUFF TOURNIQUET SINGLE 18IN (TOURNIQUET CUFF) ×2 IMPLANT
CUFF TOURNIQUET SINGLE 24IN (TOURNIQUET CUFF) IMPLANT
DRAIN TLS ROUND 10FR (DRAIN) IMPLANT
DRAPE OEC MINIVIEW 54X84 (DRAPES) IMPLANT
DRAPE SURG 17X23 STRL (DRAPES) ×2 IMPLANT
DRSG ADAPTIC 3X8 NADH LF (GAUZE/BANDAGES/DRESSINGS) ×1 IMPLANT
GAUZE SPONGE 4X4 12PLY STRL (GAUZE/BANDAGES/DRESSINGS) ×2 IMPLANT
GAUZE XEROFORM 1X8 LF (GAUZE/BANDAGES/DRESSINGS) ×2 IMPLANT
GAUZE XEROFORM 5X9 LF (GAUZE/BANDAGES/DRESSINGS) ×1 IMPLANT
GLOVE BIO SURGEON STRL SZ7.5 (GLOVE) ×1 IMPLANT
GLOVE BIOGEL M STRL SZ7.5 (GLOVE) ×2 IMPLANT
GLOVE SS BIOGEL STRL SZ 8 (GLOVE) ×1 IMPLANT
GLOVE SUPERSENSE BIOGEL SZ 8 (GLOVE) ×1
GOWN STRL REUS W/ TWL LRG LVL3 (GOWN DISPOSABLE) ×3 IMPLANT
GOWN STRL REUS W/ TWL XL LVL3 (GOWN DISPOSABLE) ×3 IMPLANT
GOWN STRL REUS W/TWL LRG LVL3 (GOWN DISPOSABLE) ×6
GOWN STRL REUS W/TWL XL LVL3 (GOWN DISPOSABLE) ×6
K-WIRE .62 (WIRE) ×1 IMPLANT
KIT BASIN OR (CUSTOM PROCEDURE TRAY) ×2 IMPLANT
KIT ROOM TURNOVER OR (KITS) ×2 IMPLANT
LOOP VESSEL MAXI BLUE (MISCELLANEOUS) IMPLANT
MANIFOLD NEPTUNE II (INSTRUMENTS) ×2 IMPLANT
NEEDLE 22X1 1/2 (OR ONLY) (NEEDLE) IMPLANT
NS IRRIG 1000ML POUR BTL (IV SOLUTION) ×2 IMPLANT
PACK ORTHO EXTREMITY (CUSTOM PROCEDURE TRAY) ×2 IMPLANT
PAD ARMBOARD 7.5X6 YLW CONV (MISCELLANEOUS) ×4 IMPLANT
PAD CAST 3X4 CTTN HI CHSV (CAST SUPPLIES) ×1 IMPLANT
PAD CAST 4YDX4 CTTN HI CHSV (CAST SUPPLIES) ×1 IMPLANT
PADDING CAST ABS 3INX4YD NS (CAST SUPPLIES) ×1
PADDING CAST ABS COTTON 3X4 (CAST SUPPLIES) IMPLANT
PADDING CAST COTTON 3X4 STRL (CAST SUPPLIES) ×2
PADDING CAST COTTON 4X4 STRL (CAST SUPPLIES) ×2
SPLINT FIBERGLASS 3X12 (CAST SUPPLIES) ×1 IMPLANT
SPONGE LAP 4X18 X RAY DECT (DISPOSABLE) IMPLANT
SUT MNCRL AB 4-0 PS2 18 (SUTURE) ×2 IMPLANT
SUT PROLENE 3 0 PS 2 (SUTURE) IMPLANT
SUT VIC AB 3-0 FS2 27 (SUTURE) IMPLANT
SYR CONTROL 10ML LL (SYRINGE) IMPLANT
SYSTEM CHEST DRAIN TLS 7FR (DRAIN) IMPLANT
TOWEL OR 17X24 6PK STRL BLUE (TOWEL DISPOSABLE) ×2 IMPLANT
TOWEL OR 17X26 10 PK STRL BLUE (TOWEL DISPOSABLE) ×2 IMPLANT
TUBE CONNECTING 12X1/4 (SUCTIONS) ×2 IMPLANT
TUBE EVACUATION TLS (MISCELLANEOUS) ×2 IMPLANT
WATER STERILE IRR 1000ML POUR (IV SOLUTION) ×2 IMPLANT

## 2015-05-14 NOTE — Op Note (Signed)
see dictation [phone]  Status post closed reduction and pinning left thumb metacarpal fracture metaphyseal in nature  Lamoyne Palencia M.D.

## 2015-05-14 NOTE — Transfer of Care (Signed)
Immediate Anesthesia Transfer of Care Note  Patient: Doris Aguilar  Procedure(s) Performed: Procedure(s): CLOSED REDUCTION  internal fixatuon left thumb metacarpal fracture (Left)  Patient Location: PACU  Anesthesia Type:General  Level of Consciousness: awake and alert   Airway & Oxygen Therapy: Patient Spontanous Breathing and Patient connected to nasal cannula oxygen  Post-op Assessment: Report given to RN, Post -op Vital signs reviewed and stable and Patient moving all extremities  Post vital signs: Reviewed and stable  Last Vitals:  Filed Vitals:   05/14/15 0629  BP: 99/66  Pulse: 62  Temp: 36.7 C  Resp: 18    Complications: No apparent anesthesia complications

## 2015-05-14 NOTE — Anesthesia Procedure Notes (Signed)
Procedure Name: LMA Insertion Date/Time: 05/14/2015 8:02 AM Performed by: Suzy Bouchard Pre-anesthesia Checklist: Timeout performed, Suction available, Patient being monitored, Emergency Drugs available and Patient identified Patient Re-evaluated:Patient Re-evaluated prior to inductionOxygen Delivery Method: Circle system utilized Preoxygenation: Pre-oxygenation with 100% oxygen Intubation Type: IV induction Ventilation: Mask ventilation without difficulty LMA: LMA inserted LMA Size: 4.0 Number of attempts: 1 Placement Confirmation: breath sounds checked- equal and bilateral and positive ETCO2 Tube secured with: Tape

## 2015-05-14 NOTE — H&P (Signed)
Doris Aguilar is an 54 y.o. female.   Chief Complaint: Left thumb metacarpal fracture HPI: Patient presents with left thumb metacarpal fracture closed. This is a metaphyseal displaced fracture.  She denies other complaints.  She's been given preoperative vancomycin.  She has no other issues at present juncture. I discussed with her all issues and plans for ORIF versus closed reduction and pinning  Past Medical History  Diagnosis Date  . Osteopenia 02/12/11    T score -1.7 of femoral neck, 0.0 of LS spine  . Thyroid disease   . Seasonal allergies   . Hypothyroidism   . Acid reflux     prior to weight loss, but none since loss of 60 lbs.   . Arthritis     hands    Past Surgical History  Procedure Laterality Date  . Tendon reconstruction      left foot March 2015  . Dog bite      1982- multiple surgeries & debridement  . Tonsillectomy      Family History  Problem Relation Age of Onset  . Osteoporosis Mother   . Stroke Mother   . Hypertension Mother   . Hyperlipidemia Mother   . Other Mother     perforated colon- surgicall induced  . Heart disease Father 67    died MI  . Colon cancer Neg Hx   . Pancreatic cancer Neg Hx   . Rectal cancer Neg Hx   . Stomach cancer Neg Hx    Social History:  reports that she has never smoked. She has never used smokeless tobacco. She reports that she drinks about 0.6 oz of alcohol per week. She reports that she does not use illicit drugs.  Allergies:  Allergies  Allergen Reactions  . Penicillins     REACTION: Thinks it was itching.    Medications Prior to Admission  Medication Sig Dispense Refill  . BLACK COHOSH PO Take 1 tablet by mouth daily.     . Cholecalciferol (VITAMIN D3) 2000 UNITS TABS Take by mouth.    . levothyroxine (SYNTHROID, LEVOTHROID) 125 MCG tablet Take 125 mcg by mouth daily before breakfast.    . loratadine (CLARITIN) 10 MG tablet Take 10 mg by mouth daily.    . Multiple Vitamin (MULTIVITAMIN) tablet Take 1  tablet by mouth daily. Womens 50+ daily    . Omega-3 Fatty Acids (FISH OIL) 1200 MG CAPS Take 1,200 mg by mouth daily.    . fluticasone (FLONASE) 50 MCG/ACT nasal spray Place 2 sprays into the nose daily. 16 g 12  . Fluticasone-Salmeterol (ADVAIR DISKUS) 500-50 MCG/DOSE AEPB Inhale 1 puff into the lungs 2 (two) times daily. (Patient taking differently: Inhale 1 puff into the lungs 2 (two) times daily as needed. ) 1 each 5  . naproxen sodium (ANAPROX) 220 MG tablet Take 220 mg by mouth daily as needed.    . predniSONE (DELTASONE) 10 MG tablet Take by mouth 3 a day 3 days, 2 a day 3 days, 1 a day 5 days 20 tablet 1  . triamcinolone cream (KENALOG) 0.1 % Apply 1 application topically 2 (two) times daily. 30 g 0    Results for orders placed or performed during the hospital encounter of 05/13/15 (from the past 48 hour(s))  Basic metabolic panel     Status: Abnormal   Collection Time: 05/13/15  2:14 PM  Result Value Ref Range   Sodium 137 135 - 145 mmol/L   Potassium 4.2 3.5 - 5.1 mmol/L  Chloride 103 101 - 111 mmol/L   CO2 25 22 - 32 mmol/L   Glucose, Bld 97 65 - 99 mg/dL   BUN 25 (H) 6 - 20 mg/dL   Creatinine, Ser 0.85 0.44 - 1.00 mg/dL   Calcium 9.3 8.9 - 10.3 mg/dL   GFR calc non Af Amer >60 >60 mL/min   GFR calc Af Amer >60 >60 mL/min    Comment: (NOTE) The eGFR has been calculated using the CKD EPI equation. This calculation has not been validated in all clinical situations. eGFR's persistently <60 mL/min signify possible Chronic Kidney Disease.    Anion gap 9 5 - 15  CBC     Status: Abnormal   Collection Time: 05/13/15  2:14 PM  Result Value Ref Range   WBC 7.9 4.0 - 10.5 K/uL   RBC 5.17 (H) 3.87 - 5.11 MIL/uL   Hemoglobin 15.3 (H) 12.0 - 15.0 g/dL   HCT 44.7 36.0 - 46.0 %   MCV 86.5 78.0 - 100.0 fL   MCH 29.6 26.0 - 34.0 pg   MCHC 34.2 30.0 - 36.0 g/dL   RDW 13.0 11.5 - 15.5 %   Platelets 269 150 - 400 K/uL   No results found.  Review of Systems  Respiratory:  Negative.   Gastrointestinal: Negative.   Genitourinary: Negative.   Psychiatric/Behavioral: Negative.     Blood pressure 99/66, pulse 62, temperature 98 F (36.7 C), resp. rate 18, height '5\' 3"'  (1.6 m), weight 75.297 kg (166 lb), last menstrual period 05/23/2011, SpO2 95 %. Physical Exam left thumb fracture close displaced intact neurovascular status.  Patient has intact sensation skin is intact swelling is quite apparent  The patient is alert and oriented in no acute distress. The patient complains of pain in the affected upper extremity.  The patient is noted to have a normal HEENT exam. Lung fields show equal chest expansion and no shortness of breath. Abdomen exam is nontender without distention. Lower extremity examination does not show any fracture dislocation or blood clot symptoms. Pelvis is stable and the neck and back are stable and nontender.  Assessment/Plan We will plan for ORIF versus closed reduction and pinning left thumb metacarpal fracture is necessary.  We are planning surgery for your upper extremity. The risk and benefits of surgery to include risk of bleeding, infection, anesthesia,  damage to normal structures and failure of the surgery to accomplish its intended goals of relieving symptoms and restoring function have been discussed in detail. With this in mind we plan to proceed. I have specifically discussed with the patient the pre-and postoperative regime and the dos and don'ts and risk and benefits in great detail. Risk and benefits of surgery also include risk of dystrophy(CRPS), chronic nerve pain, failure of the healing process to go onto completion and other inherent risks of surgery The relavent the pathophysiology of the disease/injury process, as well as the alternatives for treatment and postoperative course of action has been discussed in great detail with the patient who desires to proceed.  We will do everything in our power to help you (the patient)  restore function to the upper extremity. It is a pleasure to see this patient today.  Dawne Casali III,Simmone Cape M 05/14/2015, 7:50 AM

## 2015-05-14 NOTE — Anesthesia Postprocedure Evaluation (Signed)
  Anesthesia Post-op Note  Patient: Doris Aguilar  Procedure(s) Performed: Procedure(s): CLOSED REDUCTION  internal fixatuon left thumb metacarpal fracture (Left)  Patient Location: PACU  Anesthesia Type:General  Level of Consciousness: awake and alert   Airway and Oxygen Therapy: Patient Spontanous Breathing  Post-op Pain: mild  Post-op Assessment: Post-op Vital signs reviewed              Post-op Vital Signs: Reviewed  Last Vitals:  Filed Vitals:   05/14/15 1015  BP: 124/82  Pulse: 65  Temp: 36.7 C  Resp: 14    Complications: No apparent anesthesia complications

## 2015-05-14 NOTE — Anesthesia Preprocedure Evaluation (Addendum)
Anesthesia Evaluation  Patient identified by MRN, date of birth, ID band Patient awake    Reviewed: Allergy & Precautions, NPO status , Patient's Chart, lab work & pertinent test results  Airway Mallampati: II  TM Distance: >3 FB Neck ROM: Full    Dental  (+) Teeth Intact, Dental Advisory Given   Pulmonary neg pulmonary ROS,  breath sounds clear to auscultation        Cardiovascular negative cardio ROS  Rhythm:Regular Rate:Normal     Neuro/Psych negative neurological ROS     GI/Hepatic Neg liver ROS, GERD-  ,  Endo/Other  Hypothyroidism   Renal/GU negative Renal ROS     Musculoskeletal  (+) Arthritis -,   Abdominal   Peds  Hematology negative hematology ROS (+)   Anesthesia Other Findings   Reproductive/Obstetrics                           Anesthesia Physical Anesthesia Plan  ASA: II  Anesthesia Plan: General   Post-op Pain Management:    Induction: Intravenous  Airway Management Planned: LMA  Additional Equipment:   Intra-op Plan:   Post-operative Plan:   Informed Consent: I have reviewed the patients History and Physical, chart, labs and discussed the procedure including the risks, benefits and alternatives for the proposed anesthesia with the patient or authorized representative who has indicated his/her understanding and acceptance.     Plan Discussed with: CRNA  Anesthesia Plan Comments:         Anesthesia Quick Evaluation

## 2015-05-16 NOTE — Op Note (Signed)
NAME:  Doris Aguilar, Doris Aguilar                  ACCOUNT NO.:  MEDICAL RECORD NO.:  [id number]  LOCATION:                                 FACILITY:  PHYSICIAN:  Satira Anis. Jayland Null, M.D.DATE OF BIRTH:  01-11-1961  DATE OF PROCEDURE: DATE OF DISCHARGE:                              OPERATIVE REPORT   PREOPERATIVE DIAGNOSIS:  Displaced left thumb metaphyseal fracture, first metacarpal base region.  POSTOPERATIVE DIAGNOSIS:  Displaced left thumb metaphyseal fracture, first metacarpal base region.  PROCEDURES: 1. Left thumb metacarpal fracture closed reduction and pinning with     two 0.062 Kirschner wires. 2. AP, lateral, and oblique x-rays were performed, examined, and     interpreted by myself, left hand.  SURGEON:  Satira Anis. Amedeo Plenty, M.D.  ASSISTANT:  None.  COMPLICATIONS:  None.  ANESTHESIA:  General LMA anesthetic.  TOURNIQUET TIME:  Zero.  INDICATIONS:  This patient is a 54 year old female, presents with the above-mentioned diagnosis.  I have counseled her regarding the risks and benefits of surgery, and she desires to proceed.  All questions have been encouraged and answered.  OPERATIVE PROCEDURE:  The patient was seen by myself and Anesthesia, counseled, given preoperative vancomycin, given a general LMA anesthetic, and prepped and draped in usual sterile fashion with Hibiclens pre-scrub, followed by 10 minutes surgical Betadine scrub and paint.  Outline marks were made visually.  A time-out was called. Preoperative and postoperative check was complete; and under sterile field, the patient underwent manipulative reduction.  I was able to reduce the fracture nicely and afford fixation with two percutaneously introduced Kirschner wires from the distal radial aspect of the thumb metacarpal engaging the proximal ulnar aspect.  I crossed the thumb joint with one of the K-wires into the trapezius region.  Both wires had good fixation across the fracture site, and all looked  well.  I was able to recreate the normal anatomy.  I stress-tested her under radiograph, and all looked well.  AP, lateral, and oblique x-rays were performed, examined and interpreted by myself and looked excellent.  I then performed clipping of the pins which will be removed in the office in 4- 6 weeks, predicated upon healing.  She was given 10 mL of Sensorcaine with epinephrine for postoperative analgesia.  She will notify me should any problems occur.  I have discussed with her all the issues, do's and don'ts, etc.  The patient will be seen in the office in 12-14 days.  We will go ahead and cast her for the first 4 weeks, then consider a removable brace and likely pin removal at 6 weeks or so.  Bactrim DS 1 p.o. b.i.d. x7 days and Norco were written for pain.  Do's and don'ts have been discussed.  All questions have been encouraged and answered.  She was dressed sterilely with Adaptic, Xeroform, and a thumb spica splint.  I should note that I did treat her skin where we clipped these skins below the skin surface with pin-cutter with a Xeroform.  She was heavily padded.  There were no complications.  She was neurovascularly intact.  No tourniquet was used, and all looked well.  I was pleased with this  and the findings.  Final followup x-rays were given to the patient and her family.  Do's and don'ts have been discussed.  It was a pleasure participating in her care.  We look for participating in her postoperative recovery.     Satira Anis. Amedeo Plenty, M.D.     [facility]  D:  05/14/2015  T:  05/14/2015  Job:  [id number]

## 2015-05-17 ENCOUNTER — Encounter (HOSPITAL_COMMUNITY): Payer: Self-pay | Admitting: Orthopedic Surgery

## 2015-09-23 MED FILL — LEVOTHYROXINE 137 MCG TAB: 137 | 90 days supply | Qty: 90 | Fill #1

## 2015-09-30 DIAGNOSIS — E079 Disorder of thyroid, unspecified: Secondary | ICD-10-CM | POA: Diagnosis not present

## 2015-10-17 MED FILL — traMADol HCL 50 MG TABS: 50 | 4 days supply | Qty: 50 | Fill #1

## 2015-11-18 ENCOUNTER — Ambulatory Visit (INDEPENDENT_AMBULATORY_CARE_PROVIDER_SITE_OTHER): Payer: 59 | Admitting: Family Medicine

## 2015-11-18 ENCOUNTER — Encounter: Payer: Self-pay | Admitting: Family Medicine

## 2015-11-18 VITALS — BP 111/74 | HR 66 | Ht 63.0 in | Wt 150.0 lb

## 2015-11-18 DIAGNOSIS — M533 Sacrococcygeal disorders, not elsewhere classified: Secondary | ICD-10-CM

## 2015-11-18 DIAGNOSIS — G8929 Other chronic pain: Secondary | ICD-10-CM | POA: Insufficient documentation

## 2015-11-18 MED ORDER — METHYLPREDNISOLONE ACETATE 40 MG/ML IJ SUSP
40.0000 mg | Freq: Once | INTRAMUSCULAR | Status: AC
  Administered 2015-11-18: 40 mg via INTRA_ARTICULAR

## 2015-11-18 NOTE — Progress Notes (Signed)
  [name] - 55 y.o. female MRN [id number]  Date of birth: 27-Aug-1961  SUBJECTIVE:  Including CC & ROS.  Doris Aguilar is a 55 y.o. female who presents today for R SI joint/lumbar pain.    R low back pain, initial visit 11/18/15 - patient presents today for ongoing right posterior lumbar/SI joint back pain. This been ongoing now for several years but is really exacerbated in the past 5-6 months secondary to a thumb injury. No radicular signs or red flags going down her leg. Pt denies any current bowel/bladder problems, fever, chills, unintentional weight loss, night time awakenings secondary to pain, weakness in one or both legs.  Yoga has previously help with this but she denies any other alleviating factors.  No previous injections into the area   PMHx - Updated and reviewed.  Contributory factors include: Osteopenia PSHx - Updated and reviewed.  Contributory factors include: Left thumb ORIF FHx - Updated and reviewed.  Contributory factors include:  Negative Social Hx - Updated and reviewed. Contributory factors include: nonsmoker  Medications - Updated/reiviewed    12 point ROS negative other than per HPI.   Exam:  Filed Vitals:   11/18/15 0916  BP: 111/74  Pulse: 66    Gen: NAD, AAO 3 Cardio- RRR Pulm - Normal respiratory effort/rate Skin: No rashes or erythema Extremities: No edema  Vascular: pulses +2 bilateral upper and lower extremity Psych: Normal affect  R Hip Exam:  Pelvic alignment unremarkable to inspection and palpation. Standing hip rotation and gait without trendelenburg / unsteadiness. Greater trochanter without tenderness to palpation. No tenderness over piriformis and greater trochanter. No SI joint tenderness and normal minimal SI movement. ROM: IR: 80 Deg, ER: 80 Deg, Flexion: 120 Deg, Extension: 100 Deg, Abduction: 45 Deg, Adduction: 45 Deg Strength:  IR: 5/5, ER: 5/5, Flexion (0 and 90 degrees): 5/5, Extension: 5/5, Abduction: 5/5, Adduction:  5/5 Negative Thomas test  Negative FADIR.  Negative FADIR with axial compression + FABER in all directions, + posterior shear, +Gaenslen   Neurovascularly intact B/L LE

## 2015-11-18 NOTE — Assessment & Plan Note (Signed)
Diagnostic/Therapeutic injection into SI joint under [place] today.  She does have some + SI joint testing and Sx but if this does not work, would highly consider w/u of lumbar spine.   Aspiration/Injection Procedure Note Doris Aguilar 02-08-1961  Procedure: Injection Indications: R SI joint pain  Procedure Details Consent: Risks of procedure as well as the alternatives and risks of each were explained to the (patient/caregiver).  Consent for procedure obtained. Time Out: Verified patient identification, verified procedure, site/side was marked, verified correct patient position, special equipment/implants available, medications/allergies/relevent history reviewed, required imaging and test results available.  Performed.  The area was cleaned with iodine and alcohol swabs.    The R SI joint was injected using 1 cc's of 40mg  Depomedrol and 1 cc's of 1% lidocaine with a spinal needle.  Ultrasound was used. Images were obtained in Transverse and Long views showing the injection.    A sterile dressing was applied.  Patient did tolerate procedure well. Estimated blood loss: None

## 2015-12-26 MED FILL — LEVOTHYROXINE 137 MCG TAB: 137 | 90 days supply | Qty: 90 | Fill #2

## 2016-02-07 ENCOUNTER — Emergency Department (HOSPITAL_BASED_OUTPATIENT_CLINIC_OR_DEPARTMENT_OTHER)
Admission: EM | Admit: 2016-02-07 | Discharge: 2016-02-07 | Disposition: A | Discharge status: Home / Self Care | Payer: 59 | Attending: Emergency Medicine | Admitting: Emergency Medicine

## 2016-02-07 ENCOUNTER — Emergency Department (HOSPITAL_BASED_OUTPATIENT_CLINIC_OR_DEPARTMENT_OTHER): Payer: 59

## 2016-02-07 ENCOUNTER — Encounter (HOSPITAL_BASED_OUTPATIENT_CLINIC_OR_DEPARTMENT_OTHER): Payer: Self-pay

## 2016-02-07 DIAGNOSIS — M7989 Other specified soft tissue disorders: Secondary | ICD-10-CM | POA: Insufficient documentation

## 2016-02-07 DIAGNOSIS — M1990 Unspecified osteoarthritis, unspecified site: Secondary | ICD-10-CM | POA: Diagnosis not present

## 2016-02-07 DIAGNOSIS — M79604 Pain in right leg: Secondary | ICD-10-CM | POA: Diagnosis not present

## 2016-02-07 DIAGNOSIS — E039 Hypothyroidism, unspecified: Secondary | ICD-10-CM | POA: Diagnosis not present

## 2016-02-07 DIAGNOSIS — M79661 Pain in right lower leg: Secondary | ICD-10-CM | POA: Diagnosis not present

## 2016-02-07 NOTE — ED Notes (Signed)
Pt c/o rt leg pain x 1 day  Slight swelling  Denies inj,  States has been traveling

## 2016-02-07 NOTE — ED Notes (Signed)
C/opesterio right LE pain x today-recent 3 day travel-PCP advised via phone for pt to come to ED-NAD-steady gait

## 2016-02-07 NOTE — ED Provider Notes (Signed)
CSN: [id number]     Arrival date & time 02/07/16  1939 History   First MD Initiated Contact with Patient 02/07/16 1959     Chief Complaint  Patient presents with  . Leg Pain     (Consider location/radiation/quality/duration/timing/severity/associated sxs/prior Treatment) HPI Doris Aguilar is a 55 y.o. female here for evaluation of right lower surgery pain. Patient reports over the past weekend she has had extensive driving and been traveling in the car. She reports today she has had posterior right leg pain. She was advised by her PCP to come to the ED for ultrasound. She denies any fevers, chills, cough, hemoptysis, chest pain or shortness of breath. She denies any unilateral leg swelling, however her partner reports a 1.5 cm increase in thigh diameter. No other alleviating or modifying factors.  Past Medical History  Diagnosis Date  . Osteopenia 02/12/11    T score -1.7 of femoral neck, 0.0 of LS spine  . Thyroid disease   . Seasonal allergies   . Hypothyroidism   . Acid reflux     prior to weight loss, but none since loss of 60 lbs.   . Arthritis     hands   Past Surgical History  Procedure Laterality Date  . Tendon reconstruction      left foot March 2015  . Dog bite      1982- multiple surgeries & debridement  . Tonsillectomy    . Finger closed reduction Left 05/14/2015    Procedure: CLOSED REDUCTION  internal fixatuon left thumb metacarpal fracture;  Surgeon: Roseanne Kaufman, MD;  Location: [facility];  Service: Orthopedics;  Laterality: Left;   Family History  Problem Relation Age of Onset  . Osteoporosis Mother   . Stroke Mother   . Hypertension Mother   . Hyperlipidemia Mother   . Other Mother     perforated colon- surgicall induced  . Heart disease Father 25    died MI  . Colon cancer Neg Hx   . Pancreatic cancer Neg Hx   . Rectal cancer Neg Hx   . Stomach cancer Neg Hx    Social History  Substance Use Topics  . Smoking status: Never Smoker   . Smokeless  tobacco: Never Used  . Alcohol Use: 0.6 oz/week    1 Glasses of wine per week     Comment: occ   OB History    No data available     Review of Systems A 10 point review of systems was completed and was negative except for pertinent positives and negatives as mentioned in the history of present illness     Allergies  Penicillins  Home Medications   Prior to Admission medications   Medication Sig Start Date End Date Taking? Authorizing Provider  BLACK COHOSH PO Take 1 tablet by mouth daily.     Historical Provider, MD  Cholecalciferol (VITAMIN D3) 2000 UNITS TABS Take by mouth.    Historical Provider, MD  fluticasone (FLONASE) 50 MCG/ACT nasal spray Place into the nose.    Historical Provider, MD  levothyroxine (SYNTHROID, LEVOTHROID) 137 MCG tablet  09/23/15   Historical Provider, MD  loratadine (CLARITIN) 10 MG tablet Take 10 mg by mouth daily.    Historical Provider, MD  Multiple Vitamin (MULTIVITAMIN) tablet Take 1 tablet by mouth daily. Womens 50+ daily    Historical Provider, MD  naproxen sodium (ANAPROX) 220 MG tablet Take 220 mg by mouth daily as needed.    Historical Provider, MD  Omega-3 Fatty  Acids (FISH OIL) 1200 MG CAPS Take 1,200 mg by mouth daily.    Historical Provider, MD   BP 120/75 mmHg  Pulse 75  Temp(Src) 97.9 F (36.6 C) (Oral)  Resp 18  Ht 5\' 4"  (1.626 m)  Wt 69.4 kg  BMI 26.25 kg/m2  SpO2 100%  LMP 05/23/2011 Physical Exam  Constitutional: She is oriented to person, place, and time. She appears well-developed and well-nourished.  HENT:  Head: Normocephalic and atraumatic.  Mouth/Throat: Oropharynx is clear and moist.  Eyes: Conjunctivae are normal. Pupils are equal, round, and reactive to light. Right eye exhibits no discharge. Left eye exhibits no discharge. No scleral icterus.  Neck: Neck supple.  Cardiovascular: Normal rate, regular rhythm and normal heart sounds.   Pulmonary/Chest: Effort normal and breath sounds normal. No respiratory  distress. She has no wheezes. She has no rales.  Abdominal: Soft. There is no tenderness.  Musculoskeletal:  No obvious unilateral leg swelling or erythema. There is tenderness diffusely and posterior popliteal region. Positive Homans. Muscle compartments are soft. Distal pulses intact brisk cap refill. Maintains full active range of motion.  Neurological: She is alert and oriented to person, place, and time.  Cranial Nerves II-XII grossly intact  Skin: Skin is warm and dry. No rash noted.  Psychiatric: She has a normal mood and affect.  Nursing note and vitals reviewed.   ED Course  Procedures (including critical care time) Labs Review Labs Reviewed - No data to display  Imaging Review No results found. I have personally reviewed and evaluated these images and lab results as part of my medical decision-making.   EKG Interpretation None     Filed Vitals:   02/07/16 1952  BP: 120/75  Pulse: 75  Temp: 97.9 F (36.6 C)  TempSrc: Oral  Resp: 18  Height: 5\' 4"  (1.626 m)  Weight: 69.4 kg  SpO2: 100%    MDM  Patient with recent travel history here for evaluation of right lower leg pain. Advised by PCP to come to ED. Patient does have tenderness posterior aspect of right leg in popliteal region. Muscle compartments are soft. Distal pulses are intact. Will obtain ultrasound of lower extremity to rule out DVT. No evidence of DVT on ultrasound. Patient reports she will follow-up with PCP next week for reevaluation. Voices no other questions or concerns at this time. Overall appears well, nontoxic, hemodynamically stable and appropriate for discharge. Prior to patient discharge, I discussed and reviewed this case with Dr.Haviland  Final diagnoses:  Leg swelling        Comer Locket, PA-C 02/07/16 2239  Isla Pence, MD 02/08/16 1530

## 2016-02-07 NOTE — Discharge Instructions (Signed)
There does not appear to be an emergent cause for your symptoms at this time. Your ultrasound was negative for any DVT. Follow-up with your doctor as needed next week. Return to ED for new or worsening symptoms.

## 2016-03-19 MED FILL — LEVOTHYROXINE 137 MCG TAB: 137 | 90 days supply | Qty: 90 | Fill #3

## 2016-04-27 DIAGNOSIS — H52223 Regular astigmatism, bilateral: Secondary | ICD-10-CM | POA: Diagnosis not present

## 2016-04-27 DIAGNOSIS — H25012 Cortical age-related cataract, left eye: Secondary | ICD-10-CM | POA: Diagnosis not present

## 2016-04-27 DIAGNOSIS — H2512 Age-related nuclear cataract, left eye: Secondary | ICD-10-CM | POA: Diagnosis not present

## 2016-04-27 DIAGNOSIS — H524 Presbyopia: Secondary | ICD-10-CM | POA: Diagnosis not present

## 2016-04-27 DIAGNOSIS — H2511 Age-related nuclear cataract, right eye: Secondary | ICD-10-CM | POA: Diagnosis not present

## 2016-04-27 DIAGNOSIS — H5203 Hypermetropia, bilateral: Secondary | ICD-10-CM | POA: Diagnosis not present

## 2016-06-15 DIAGNOSIS — Z23 Encounter for immunization: Secondary | ICD-10-CM | POA: Diagnosis not present

## 2016-06-15 DIAGNOSIS — R7989 Other specified abnormal findings of blood chemistry: Secondary | ICD-10-CM | POA: Diagnosis not present

## 2016-06-15 DIAGNOSIS — E663 Overweight: Secondary | ICD-10-CM | POA: Diagnosis not present

## 2016-06-15 DIAGNOSIS — E079 Disorder of thyroid, unspecified: Secondary | ICD-10-CM | POA: Diagnosis not present

## 2016-06-19 MED FILL — LEVOTHYROXINE 137 MCG TAB: 137 | 90 days supply | Qty: 90 | Fill #0

## 2016-08-31 DIAGNOSIS — Z1231 Encounter for screening mammogram for malignant neoplasm of breast: Secondary | ICD-10-CM | POA: Diagnosis not present

## 2016-09-17 MED FILL — LEVOTHYROXINE 137 MCG TAB: 137 | 90 days supply | Qty: 90 | Fill #1

## 2016-11-08 ENCOUNTER — Ambulatory Visit (INDEPENDENT_AMBULATORY_CARE_PROVIDER_SITE_OTHER): Payer: 59 | Admitting: *Deleted

## 2016-11-08 DIAGNOSIS — Z23 Encounter for immunization: Secondary | ICD-10-CM | POA: Diagnosis not present

## 2016-11-08 NOTE — Progress Notes (Signed)
   Patient presents for TDaP and Hep A vaccinations States feeling well; denies previous reactions to immunizations, Tolerated injections well. Hubbard Hartshorn, RN, BSN

## 2016-11-16 ENCOUNTER — Other Ambulatory Visit: Payer: Self-pay | Admitting: *Deleted

## 2016-11-16 MED ORDER — ALBUTEROL SULFATE HFA 108 (90 BASE) MCG/ACT IN AERS: 1.0000 | INHALATION_SPRAY | Freq: Four times a day (QID) | RESPIRATORY_TRACT | 12 refills | Status: DC | PRN

## 2016-11-16 MED ORDER — AZITHROMYCIN 500 MG PO TABS: ORAL_TABLET | ORAL | 0 refills | Status: DC

## 2016-11-16 MED ORDER — ATOVAQUONE-PROGUANIL HCL 250-100 MG PO TABS: ORAL_TABLET | ORAL | 0 refills | Status: DC

## 2016-11-16 MED ORDER — ONDANSETRON 4 MG PO TBDP: 4.0000 mg | ORAL_TABLET | Freq: Three times a day (TID) | ORAL | 0 refills | Status: DC | PRN

## 2016-11-16 MED ORDER — TINIDAZOLE 250 MG PO TABS: ORAL_TABLET | ORAL | 0 refills | Status: DC

## 2016-11-16 MED FILL — TINIDAZOLE 500 MG TABLET: 500 | 7 days supply | Qty: 4 | Fill #0

## 2016-11-16 MED FILL — AZITHROMYCIN 500 MG TABLET: 500 | 6 days supply | Qty: 6 | Fill #0

## 2016-11-16 MED FILL — ATOVAQUONE-PROGUANIL 250-10: 250-100 | 20 days supply | Qty: 20 | Fill #0

## 2016-11-16 MED FILL — ONDANSETRON ODT 4 MG TABLET: 4 | 7 days supply | Qty: 20 | Fill #0

## 2016-11-16 MED FILL — VENTOLIN HFA 90 MCG INHALER: 108 (90 BAS | 25 days supply | Qty: 18 | Fill #0

## 2016-12-12 DIAGNOSIS — E079 Disorder of thyroid, unspecified: Secondary | ICD-10-CM | POA: Diagnosis not present

## 2016-12-13 MED FILL — LEVOTHYROXINE 137 MCG TAB: 137 | 90 days supply | Qty: 90 | Fill #0

## 2017-03-11 MED FILL — LEVOTHYROXINE 137 MCG TAB: 137 | 90 days supply | Qty: 90 | Fill #1

## 2017-06-12 DIAGNOSIS — Z23 Encounter for immunization: Secondary | ICD-10-CM | POA: Diagnosis not present

## 2017-06-13 ENCOUNTER — Ambulatory Visit (INDEPENDENT_AMBULATORY_CARE_PROVIDER_SITE_OTHER): Payer: 59 | Admitting: *Deleted

## 2017-06-13 DIAGNOSIS — Z23 Encounter for immunization: Secondary | ICD-10-CM

## 2017-06-24 MED FILL — LEVOTHYROXINE 137 MCG TAB: 137 | 90 days supply | Qty: 90 | Fill #2

## 2017-08-09 DIAGNOSIS — H9190 Unspecified hearing loss, unspecified ear: Secondary | ICD-10-CM | POA: Diagnosis not present

## 2017-08-09 DIAGNOSIS — E039 Hypothyroidism, unspecified: Secondary | ICD-10-CM | POA: Diagnosis not present

## 2017-08-09 MED FILL — LEVOTHYROXINE 150 MCG TAB: 150 | 90 days supply | Qty: 90 | Fill #0

## 2017-09-27 DIAGNOSIS — Z1231 Encounter for screening mammogram for malignant neoplasm of breast: Secondary | ICD-10-CM | POA: Diagnosis not present

## 2017-10-07 ENCOUNTER — Other Ambulatory Visit: Payer: Self-pay

## 2017-10-07 ENCOUNTER — Ambulatory Visit: Payer: 59 | Attending: Family Medicine | Admitting: Audiology

## 2017-10-07 DIAGNOSIS — R292 Abnormal reflex: Secondary | ICD-10-CM | POA: Insufficient documentation

## 2017-10-07 DIAGNOSIS — Z8669 Personal history of other diseases of the nervous system and sense organs: Secondary | ICD-10-CM | POA: Insufficient documentation

## 2017-10-07 DIAGNOSIS — Z01118 Encounter for examination of ears and hearing with other abnormal findings: Secondary | ICD-10-CM | POA: Insufficient documentation

## 2017-10-07 DIAGNOSIS — R94128 Abnormal results of other function studies of ear and other special senses: Secondary | ICD-10-CM | POA: Insufficient documentation

## 2017-10-07 DIAGNOSIS — H93299 Other abnormal auditory perceptions, unspecified ear: Secondary | ICD-10-CM | POA: Diagnosis not present

## 2017-10-07 DIAGNOSIS — H903 Sensorineural hearing loss, bilateral: Secondary | ICD-10-CM | POA: Insufficient documentation

## 2017-10-07 MED ORDER — NITROGLYCERIN 0.2 MG/HR TD PT24: MEDICATED_PATCH | TRANSDERMAL | 1 refills | Status: DC

## 2017-10-07 MED FILL — NITROGLYCERIN 0.2 MG/HR PTC: 0.2 | 30 days supply | Qty: 8 | Fill #0

## 2017-10-07 NOTE — Procedures (Signed)
[facility] [address] [place] [address] Main: [phone] Fax: [phone]  AUDIOLOGICAL EVALUATION  NAME: Doris Aguilar DATE:   10/07/2017 DOB:  1960/11/02  REFERRAL:  Hearing difficulty, unspecified MRN:  [id number]  REFERENT:  Montez Morita, MD  CASE HISTORY Doris Aguilar, a 57 y.o.-year-old was seen for an audiological evaluation following a failed hearing screen at her "yearly physical".  Primary Concern: Concerns about a "progressive hearing loss over the past 5 years".  Pain: None History of hearing problems: Y - Had hearing evaluation at [facility] in 1995 "significant hearing loss on one ear and less on another".  "Has gotten worse in past couple of years, especially in background noise".   History of ear infections:  Y- "many as a child". Last one in "2018- first one in a long time".  History of ear surgery or "tubes" : N History of dizziness/vertigo:   N History of balance issues:  Y - but has "problems with left foot" and sometimes needs to use "boot to walk with". Tinnitus: Occasionally, not consistently. Sound sensitivity: N History of occupational noise exposure: N History of hypertension: N History of diabetes:  N Family history of hearing loss:  Y - grandfather age 52's Other concerns: "allergies". Treated for hypothyroidism - under control with medication Medications: Synthroid 150mg .   TEST PROCEDURES Tympanometry, ipsilateral and contralateral acoustic reflexes,  pure tone audiometry, and speech audiometry were administered.  TEST RESULTS Tympanometric values for middle ear volume, pressure, and compliance show normal results on the right side (Type A) with essentially normal results on the left with a "w" configuration consistent with significant history of abnormal middle ear function(Type Aw).  Ipsilateral acoustic reflex thresholds were present at 500, 1000 at 85-90dB bilaterally with elevated  responses at 2000, and 4000 Hz of 95-100dB on the left and 95-no response on the right which is consistent with the degree of hearing loss and is consistent with a normal acoustic reflex pathway up to the level of the auditory brainstem, bilaterally based on the responses from 500Hz  - 2000Hz ..  Pure tone audiometry shows bilateral hearing thresholds of 35-40 dB from 250Hz  - 2000Hz . At 4000Hz , hearing thresholds are 35 dBHL on the left with masked right ear results of 55 dBHL and at 8000Hz  70 dBHL on the left and 90dBHL on the right.  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  Speech audiometry revealed speech reception thresholds at 40dB HL and 35dBHL in the right and left ears, respectively.  Suprathreshold word recognition scores in quiet were 100% at 70dBHL on the right and 65 dBHL on the left using recorded NU-6 word lists. This is within the expected range compared to hearing thresholds.  Speech-in-noise scores drops to  56% on the right and 50% on the left at +5 dB SNR.  The overall test reliability was judged to be good.  SUMMARY AND RECOMMENDATIONS Summary: Doris Aguilar has a significant hearing loss bilaterally. She has a bilateral mild to moderate low and mid range hearing loss with a bilateral high frequency hearing that is poorer on the right side.  The left high frequencies slope from mild/moderate to severe.  The right high frequencies slope from moderate to profound.  The hearing loss appears sensorineural bilaterally.  Doris Aguilar has excellent word recognition in quiet a loud conversational speech levels. Difficulty hearing normal conversational speech is expected, especially with higher pitched voices such as with children.  In minimal background  noise Doris Aguilar's word recognition drops to poor bilaterally.  Significant difficulty hearing in most social situations as well as in the car is expected.    Recommendations: 1)  A hearing aid evaluation.  Since Doris Aguilar has [employer], it is important to obtain amplification from a participating provider such as [facility].  Doris Aguilar plans to call this office for an appointment.  2) Monitor hearing because of the poorer right sided high frequency hearing loss to rule out a progressive hearing loss. A repeat audiological evaluation is recommended in 3 months - earlier if there are changes or concerns. This test may be completed here or with the hearing aid fitting audiologist.  3)  Please note, a high frequency asymmetrical hearing loss generally requires an ENT evaluation to rule out medical pathologist. From Doris Aguilar's history, she has been aware that one ear has poorer hearing than the other however, a search of past [facility] notes yielded no results.  An ENT referral now or definitely if hearing becomes worse is recommended.    Kae Heller, AuD, CCC-A Doctor of Audiology 10/07/2017

## 2017-10-18 DIAGNOSIS — H903 Sensorineural hearing loss, bilateral: Secondary | ICD-10-CM | POA: Diagnosis not present

## 2017-10-22 DIAGNOSIS — H903 Sensorineural hearing loss, bilateral: Secondary | ICD-10-CM | POA: Diagnosis not present

## 2017-11-08 DIAGNOSIS — E039 Hypothyroidism, unspecified: Secondary | ICD-10-CM | POA: Diagnosis not present

## 2017-11-14 MED FILL — LEVOTHYROXINE 150 MCG TAB: 150 | 90 days supply | Qty: 90 | Fill #0

## 2017-11-18 ENCOUNTER — Encounter: Payer: Self-pay | Admitting: Audiology

## 2018-02-10 MED FILL — LEVOTHYROXINE 150 MCG TAB: 150 | 90 days supply | Qty: 90 | Fill #1

## 2018-03-07 ENCOUNTER — Encounter: Payer: Self-pay | Admitting: Family Medicine

## 2018-03-07 ENCOUNTER — Ambulatory Visit (INDEPENDENT_AMBULATORY_CARE_PROVIDER_SITE_OTHER): Payer: 59 | Admitting: Family Medicine

## 2018-03-07 VITALS — BP 110/70 | HR 70 | Ht 63.0 in | Wt 160.0 lb

## 2018-03-07 DIAGNOSIS — Z0000 Encounter for general adult medical examination without abnormal findings: Secondary | ICD-10-CM | POA: Diagnosis not present

## 2018-03-07 NOTE — Progress Notes (Signed)
Date of Visit: 03/07/2018   HPI:  Patient presents today for a well woman exam, needed for insurance purposes by July 1, and we were able to work her in today.  Concerns today: none Periods: postmenopausal Contraception: n/a Pelvic symptoms: denies vag discharge or pelvic pain Pap smear status: last pap 06/2015, normal cytology, neg HPV Exercise: exercises regularly, does aerobics and weights Smoking: no  Alcohol: occasional Drugs: no Mood: no concerns  Spot on back: scratched a place that bled on her upper back a few weeks ago, wife advised her to get it checked out. Not currently bothering her.  ROS: See HPI  Nunez:  Cancers in family:  - grandmother with liver mets at age 42, unknown primary - dad died of MI at age 75. Patient denies any chest pain or shortness of breath. - mom died of progressive supranuclear palsy  PHYSICAL EXAM: BP 110/70   Pulse 70   Ht 5\' 3"  (1.6 m)   Wt 160 lb (72.6 kg)   LMP 05/23/2011   BMI 28.34 kg/m  Gen: NAD, pleasant, cooperative HEENT: NCAT, PERRL, no palpable thyromegaly or anterior cervical lymphadenopathy Heart: RRR, no murmurs Lungs: CTAB, NWOB Abdomen: soft, nontender to palpation Neuro: grossly nonfocal, speech normal Extremities: No appreciable lower extremity edema bilaterally  Skin: small area of dry skin on upper back, not a distinct lesion  ASSESSMENT/PLAN:  Health maintenance:  -pap smear: UTD -mammogram: UTD -immunizations: UTD -discussed HIV & hep C screening with patient today - she declines at this time -handout given on health maintenance topics  Spot on back No identifiable discrete lesion, though could be early AK. Advised having wife monitor the area for changes. Follow up as needed.  [place]. Ardelia Mems, [place]

## 2018-03-07 NOTE — Patient Instructions (Signed)
Health Maintenance for Postmenopausal Women Menopause is a normal process in which your reproductive ability comes to an end. This process happens gradually over a span of months to years, usually between the ages of 48 and 55. Menopause is complete when you have missed 12 consecutive menstrual periods. It is important to talk with your health care provider about some of the most common conditions that affect postmenopausal women, such as heart disease, cancer, and bone loss (osteoporosis). Adopting a healthy lifestyle and getting preventive care can help to promote your health and wellness. Those actions can also lower your chances of developing some of these common conditions. What should I know about menopause? During menopause, you may experience a number of symptoms, such as:  Moderate-to-severe hot flashes.  Night sweats.  Decrease in sex drive.  Mood swings.  Headaches.  Tiredness.  Irritability.  Memory problems.  Insomnia.  Choosing to treat or not to treat menopausal changes is an individual decision that you make with your health care provider. What should I know about hormone replacement therapy and supplements? Hormone therapy products are effective for treating symptoms that are associated with menopause, such as hot flashes and night sweats. Hormone replacement carries certain risks, especially as you become older. If you are thinking about using estrogen or estrogen with progestin treatments, discuss the benefits and risks with your health care provider. What should I know about heart disease and stroke? Heart disease, heart attack, and stroke become more likely as you age. This may be due, in part, to the hormonal changes that your body experiences during menopause. These can affect how your body processes dietary fats, triglycerides, and cholesterol. Heart attack and stroke are both medical emergencies. There are many things that you can do to help prevent heart disease  and stroke:  Have your blood pressure checked at least every 1-2 years. High blood pressure causes heart disease and increases the risk of stroke.  If you are 55-79 years old, ask your health care provider if you should take aspirin to prevent a heart attack or a stroke.  Do not use any tobacco products, including cigarettes, chewing tobacco, or electronic cigarettes. If you need help quitting, ask your health care provider.  It is important to eat a healthy diet and maintain a healthy weight. ? Be sure to include plenty of vegetables, fruits, low-fat dairy products, and lean protein. ? Avoid eating foods that are high in solid fats, added sugars, or salt (sodium).  Get regular exercise. This is one of the most important things that you can do for your health. ? Try to exercise for at least 150 minutes each week. The type of exercise that you do should increase your heart rate and make you sweat. This is known as moderate-intensity exercise. ? Try to do strengthening exercises at least twice each week. Do these in addition to the moderate-intensity exercise.  Know your numbers.Ask your health care provider to check your cholesterol and your blood glucose. Continue to have your blood tested as directed by your health care provider.  What should I know about cancer screening? There are several types of cancer. Take the following steps to reduce your risk and to catch any cancer development as early as possible. Breast Cancer  Practice breast self-awareness. ? This means understanding how your breasts normally appear and feel. ? It also means doing regular breast self-exams. Let your health care provider know about any changes, no matter how small.  If you are 40   are 5 or older, have a clinician do a breast exam (clinical breast exam or CBE) every year. Depending on your age, family history, and medical history, it may be recommended that you also have a yearly breast X-ray  (mammogram).  If you have a family history of breast cancer, talk with your health care provider about genetic screening.  If you are at high risk for breast cancer, talk with your health care provider about having an MRI and a mammogram every year.  Breast cancer (BRCA) gene test is recommended for women who have family members with BRCA-related cancers. Results of the assessment will determine the need for genetic counseling and BRCA1 and for BRCA2 testing. BRCA-related cancers include these types: ? Breast. This occurs in males or females. ? Ovarian. ? Tubal. This may also be called fallopian tube cancer. ? Cancer of the abdominal or pelvic lining (peritoneal cancer). ? Prostate. ? Pancreatic.  Cervical, Uterine, and Ovarian Cancer Your health care provider may recommend that you be screened regularly for cancer of the pelvic organs. These include your ovaries, uterus, and vagina. This screening involves a pelvic exam, which includes checking for microscopic changes to the surface of your cervix (Pap test).  For women ages 21-65, health care providers may recommend a pelvic exam and a Pap test every three years. For women ages 26-65, they may recommend the Pap test and pelvic exam, combined with testing for human papilloma virus (HPV), every five years. Some types of HPV increase your risk of cervical cancer. Testing for HPV may also be done on women of any age who have unclear Pap test results.  Other health care providers may not recommend any screening for nonpregnant women who are considered low risk for pelvic cancer and have no symptoms. Ask your health care provider if a screening pelvic exam is right for you.  If you have had past treatment for cervical cancer or a condition that could lead to cancer, you need Pap tests and screening for cancer for at least 20 years after your treatment. If Pap tests have been discontinued for you, your risk factors (such as having a new sexual  partner) need to be reassessed to determine if you should start having screenings again. Some women have medical problems that increase the chance of getting cervical cancer. In these cases, your health care provider may recommend that you have screening and Pap tests more often.  If you have a family history of uterine cancer or ovarian cancer, talk with your health care provider about genetic screening.  If you have vaginal bleeding after reaching menopause, tell your health care provider.  There are currently no reliable tests available to screen for ovarian cancer.  Lung Cancer Lung cancer screening is recommended for adults 84-72 years old who are at high risk for lung cancer because of a history of smoking. A yearly low-dose CT scan of the lungs is recommended if you:  Currently smoke.  Have a history of at least 30 pack-years of smoking and you currently smoke or have quit within the past 15 years. A pack-year is smoking an average of one pack of cigarettes per day for one year.  Yearly screening should:  Continue until it has been 15 years since you quit.  Stop if you develop a health problem that would prevent you from having lung cancer treatment.  Colorectal Cancer  This type of cancer can be detected and can often be prevented.  Routine colorectal cancer screening usually  age 50 and continues through age 75.  If you have risk factors for colon cancer, your health care provider may recommend that you be screened at an earlier age.  If you have a family history of colorectal cancer, talk with your health care provider about genetic screening.  Your health care provider may also recommend using home test kits to check for hidden blood in your stool.  A small camera at the end of a tube can be used to examine your colon directly (sigmoidoscopy or colonoscopy). This is done to check for the earliest forms of colorectal cancer.  Direct examination of the colon should be repeated every  5-10 years until age 75. However, if early forms of precancerous polyps or small growths are found or if you have a family history or genetic risk for colorectal cancer, you may need to be screened more often.  Skin Cancer  Check your skin from head to toe regularly.  Monitor any moles. Be sure to tell your health care provider: ? About any new moles or changes in moles, especially if there is a change in a mole's shape or color. ? If you have a mole that is larger than the size of a pencil eraser.  If any of your family members has a history of skin cancer, especially at a young age, talk with your health care provider about genetic screening.  Always use sunscreen. Apply sunscreen liberally and repeatedly throughout the day.  Whenever you are outside, protect yourself by wearing long sleeves, pants, a wide-brimmed hat, and sunglasses.  What should I know about osteoporosis? Osteoporosis is a condition in which bone destruction happens more quickly than new bone creation. After menopause, you may be at an increased risk for osteoporosis. To help prevent osteoporosis or the bone fractures that can happen because of osteoporosis, the following is recommended:  If you are 19-50 years old, get at least 1,000 mg of calcium and at least 600 mg of vitamin D per day.  If you are older than age 50 but younger than age 70, get at least 1,200 mg of calcium and at least 600 mg of vitamin D per day.  If you are older than age 70, get at least 1,200 mg of calcium and at least 800 mg of vitamin D per day.  Smoking and excessive alcohol intake increase the risk of osteoporosis. Eat foods that are rich in calcium and vitamin D, and do weight-bearing exercises several times each week as directed by your health care provider. What should I know about how menopause affects my mental health? Depression may occur at any age, but it is more common as you become older. Common symptoms of depression  include:  Low or sad mood.  Changes in sleep patterns.  Changes in appetite or eating patterns.  Feeling an overall lack of motivation or enjoyment of activities that you previously enjoyed.  Frequent crying spells.  Talk with your health care provider if you think that you are experiencing depression. What should I know about immunizations? It is important that you get and maintain your immunizations. These include:  Tetanus, diphtheria, and pertussis (Tdap) booster vaccine.  Influenza every year before the flu season begins.  Pneumonia vaccine.  Shingles vaccine.  Your health care provider may also recommend other immunizations. This information is not intended to replace advice given to you by your health care provider. Make sure you discuss any questions you have with your health care provider. Document Released: 10/19/2005   Document Revised: 03/16/2016 Document Reviewed: 05/31/2015 Elsevier Interactive Patient Education  2018 Elsevier Inc[employer]  

## 2018-04-28 MED FILL — LEVOTHYROXINE 150 MCG TAB: 150 | 90 days supply | Qty: 90 | Fill #2

## 2018-08-01 DIAGNOSIS — Z23 Encounter for immunization: Secondary | ICD-10-CM | POA: Diagnosis not present

## 2018-08-20 MED FILL — LEVOTHYROXINE 150 MCG TAB: 150 | 90 days supply | Qty: 90 | Fill #3

## 2018-10-03 DIAGNOSIS — Z1231 Encounter for screening mammogram for malignant neoplasm of breast: Secondary | ICD-10-CM | POA: Diagnosis not present

## 2018-10-17 DIAGNOSIS — R928 Other abnormal and inconclusive findings on diagnostic imaging of breast: Secondary | ICD-10-CM | POA: Diagnosis not present

## 2018-11-20 MED FILL — LEVOTHYROXINE 150 MCG TAB: 150 | 90 days supply | Qty: 90 | Fill #0

## 2019-02-01 ENCOUNTER — Telehealth (INDEPENDENT_AMBULATORY_CARE_PROVIDER_SITE_OTHER): Payer: 59 | Admitting: Family Medicine

## 2019-02-01 DIAGNOSIS — J111 Influenza due to unidentified influenza virus with other respiratory manifestations: Secondary | ICD-10-CM

## 2019-02-01 DIAGNOSIS — Z20822 Contact with and (suspected) exposure to covid-19: Secondary | ICD-10-CM

## 2019-02-01 DIAGNOSIS — R6889 Other general symptoms and signs: Secondary | ICD-10-CM | POA: Diagnosis not present

## 2019-02-01 MED ORDER — ALBUTEROL SULFATE HFA 108 (90 BASE) MCG/ACT IN AERS
2.0000 | INHALATION_SPRAY | Freq: Four times a day (QID) | RESPIRATORY_TRACT | 0 refills | Status: DC | PRN
Start: 2019-02-01 — End: 2019-02-26

## 2019-02-01 NOTE — Progress Notes (Signed)
[place] Telemedicine Visit  Patient consented to have virtual visit. Method of visit: Telephone  Encounter participants: Patient: Doris Aguilar - located at home Provider: Chrisandra Netters - located at office Others (if applicable): wife  Chief Complaint: cough, body aches, sweats  HPI:  Patient seen today via telephone visit. Symptoms began Tuesday of this past week with headache. Headache continued into Wednesday and Thursday, at which time she started feeling worse. Began coughing Thursday. Began having severe aches on Friday. Wife is a physician and measured her resp rate in the low 30s on Friday. Heart rate up as high as 108 and is now in the high 90s to low 100s. Pulse ox has gotten as low as 92%.   Patient has felt very tired, spent much of the last several days in bed. No congestion or sneezing but today has noticed diminished sense of taste and smell. No fevers but has had drenching sweats and chills. No vomiting or nausea.  Has been taking ibuprofen & acetaminophen alternating. Appetite decreased but is able to drink fluids. Gets some mild shortness of breath with exertion but is breathing fine when she sits still. Has history of asthma in the past, but doing well with it in the last few years. Does not currently have albuterol inhaler on hand. No rashes. No tick bites.  ROS: per HPI  Pertinent PMHx: history of hypothyroidism, asthma  Exam:  Respiratory: Patient speaking normally in full sentences throughout the encounter, without any respiratory distress evident.   Assessment/Plan:  58 yo F with myalgias, sweats, cough, diminished taste and smell. In light of global pandemic, symptoms concerning for COVID. Not in any distress during the phone visit. Appropriate for home monitoring at this time.  Will arrange for outpatient drive up testing. Discussed return precautions and reasons to go to ED (if pulse ox is below 92 sustained, increased  difficulty breathing, inability to stay hydrated). Sent in albuterol inhaler given her history of asthma.  Discussed recommendation for remaining away from others and isolation from those in her household as much as possible until we have results of COVID test.  Patient agreeable to this plan & appreciative.  Time spent during visit with patient: 12 minutes

## 2019-02-02 ENCOUNTER — Encounter: Payer: Self-pay | Admitting: Family Medicine

## 2019-02-03 ENCOUNTER — Other Ambulatory Visit: Payer: 59

## 2019-02-03 ENCOUNTER — Other Ambulatory Visit: Payer: Self-pay

## 2019-02-03 ENCOUNTER — Telehealth: Payer: Self-pay | Admitting: Family Medicine

## 2019-02-03 DIAGNOSIS — Z20822 Contact with and (suspected) exposure to covid-19: Secondary | ICD-10-CM

## 2019-02-03 DIAGNOSIS — Z20828 Contact with and (suspected) exposure to other viral communicable diseases: Secondary | ICD-10-CM

## 2019-02-03 NOTE — Telephone Encounter (Signed)
Called patient and scheduled for the covid-19 test for today 02/03/2019 at the [facility] test site. Pt advised of wearing a mask, staying in the car and keeping the window rolled up until ready to be tested. Pt voiced understanding.

## 2019-02-04 LAB — NOVEL CORONAVIRUS, NAA: SARS-CoV-2, NAA: NOT DETECTED

## 2019-02-26 ENCOUNTER — Other Ambulatory Visit: Payer: Self-pay | Admitting: Family Medicine

## 2019-03-19 MED FILL — LEVOTHYROXINE 150 MCG TAB: 150 | 90 days supply | Qty: 90 | Fill #1

## 2019-03-31 DIAGNOSIS — L0291 Cutaneous abscess, unspecified: Secondary | ICD-10-CM | POA: Diagnosis not present

## 2019-04-09 DIAGNOSIS — G35 Multiple sclerosis: Secondary | ICD-10-CM | POA: Diagnosis not present

## 2019-04-09 DIAGNOSIS — R413 Other amnesia: Secondary | ICD-10-CM | POA: Diagnosis not present

## 2019-04-10 DIAGNOSIS — H9190 Unspecified hearing loss, unspecified ear: Secondary | ICD-10-CM | POA: Diagnosis not present

## 2019-04-10 DIAGNOSIS — E039 Hypothyroidism, unspecified: Secondary | ICD-10-CM | POA: Diagnosis not present

## 2019-04-10 DIAGNOSIS — Z8619 Personal history of other infectious and parasitic diseases: Secondary | ICD-10-CM | POA: Diagnosis not present

## 2019-04-10 DIAGNOSIS — H6692 Otitis media, unspecified, left ear: Secondary | ICD-10-CM | POA: Diagnosis not present

## 2019-04-10 DIAGNOSIS — L720 Epidermal cyst: Secondary | ICD-10-CM | POA: Diagnosis not present

## 2019-04-10 DIAGNOSIS — Z0000 Encounter for general adult medical examination without abnormal findings: Secondary | ICD-10-CM | POA: Diagnosis not present

## 2019-04-23 DIAGNOSIS — R9082 White matter disease, unspecified: Secondary | ICD-10-CM | POA: Diagnosis not present

## 2019-04-23 DIAGNOSIS — G9389 Other specified disorders of brain: Secondary | ICD-10-CM | POA: Diagnosis not present

## 2019-04-23 DIAGNOSIS — R413 Other amnesia: Secondary | ICD-10-CM | POA: Diagnosis not present

## 2019-04-23 DIAGNOSIS — G35 Multiple sclerosis: Secondary | ICD-10-CM | POA: Diagnosis not present

## 2019-05-11 DIAGNOSIS — D485 Neoplasm of uncertain behavior of skin: Secondary | ICD-10-CM | POA: Diagnosis not present

## 2019-05-11 DIAGNOSIS — L814 Other melanin hyperpigmentation: Secondary | ICD-10-CM | POA: Diagnosis not present

## 2019-05-11 DIAGNOSIS — L739 Follicular disorder, unspecified: Secondary | ICD-10-CM | POA: Diagnosis not present

## 2019-05-11 DIAGNOSIS — L720 Epidermal cyst: Secondary | ICD-10-CM | POA: Diagnosis not present

## 2019-05-13 DIAGNOSIS — I2510 Atherosclerotic heart disease of native coronary artery without angina pectoris: Secondary | ICD-10-CM | POA: Diagnosis not present

## 2019-06-20 DIAGNOSIS — Z23 Encounter for immunization: Secondary | ICD-10-CM | POA: Diagnosis not present

## 2019-07-10 DIAGNOSIS — H5203 Hypermetropia, bilateral: Secondary | ICD-10-CM | POA: Diagnosis not present

## 2019-07-10 DIAGNOSIS — H2513 Age-related nuclear cataract, bilateral: Secondary | ICD-10-CM | POA: Diagnosis not present

## 2019-07-10 DIAGNOSIS — H524 Presbyopia: Secondary | ICD-10-CM | POA: Diagnosis not present

## 2019-07-10 DIAGNOSIS — H52223 Regular astigmatism, bilateral: Secondary | ICD-10-CM | POA: Diagnosis not present

## 2019-07-21 DIAGNOSIS — R200 Anesthesia of skin: Secondary | ICD-10-CM | POA: Diagnosis not present

## 2019-07-21 DIAGNOSIS — M79601 Pain in right arm: Secondary | ICD-10-CM | POA: Diagnosis not present

## 2019-07-22 MED FILL — LEVOTHYROXINE 150 MCG TAB: 150 | 90 days supply | Qty: 90 | Fill #0

## 2019-10-02 DIAGNOSIS — R0602 Shortness of breath: Secondary | ICD-10-CM | POA: Diagnosis not present

## 2019-10-02 DIAGNOSIS — J449 Chronic obstructive pulmonary disease, unspecified: Secondary | ICD-10-CM | POA: Diagnosis not present

## 2019-10-09 DIAGNOSIS — Z1231 Encounter for screening mammogram for malignant neoplasm of breast: Secondary | ICD-10-CM | POA: Diagnosis not present

## 2019-10-14 DIAGNOSIS — J449 Chronic obstructive pulmonary disease, unspecified: Secondary | ICD-10-CM | POA: Diagnosis not present

## 2019-10-19 MED FILL — LEVOTHYROXINE SODIUM 150 MC: 150 | 90 days supply | Qty: 90 | Fill #0

## 2019-10-23 DIAGNOSIS — Z23 Encounter for immunization: Secondary | ICD-10-CM | POA: Diagnosis not present

## 2019-11-03 DIAGNOSIS — I10 Essential (primary) hypertension: Secondary | ICD-10-CM | POA: Diagnosis not present

## 2019-11-03 DIAGNOSIS — Z720 Tobacco use: Secondary | ICD-10-CM | POA: Diagnosis not present

## 2019-11-03 DIAGNOSIS — I2510 Atherosclerotic heart disease of native coronary artery without angina pectoris: Secondary | ICD-10-CM | POA: Diagnosis not present

## 2019-11-20 DIAGNOSIS — Z23 Encounter for immunization: Secondary | ICD-10-CM | POA: Diagnosis not present

## 2019-11-20 DIAGNOSIS — E039 Hypothyroidism, unspecified: Secondary | ICD-10-CM | POA: Diagnosis not present

## 2019-12-17 DIAGNOSIS — H6123 Impacted cerumen, bilateral: Secondary | ICD-10-CM | POA: Diagnosis not present

## 2019-12-17 DIAGNOSIS — F17200 Nicotine dependence, unspecified, uncomplicated: Secondary | ICD-10-CM | POA: Diagnosis not present

## 2019-12-17 DIAGNOSIS — H6090 Unspecified otitis externa, unspecified ear: Secondary | ICD-10-CM | POA: Diagnosis not present

## 2019-12-17 DIAGNOSIS — M25562 Pain in left knee: Secondary | ICD-10-CM | POA: Diagnosis not present

## 2019-12-17 DIAGNOSIS — I2510 Atherosclerotic heart disease of native coronary artery without angina pectoris: Secondary | ICD-10-CM | POA: Diagnosis not present

## 2019-12-31 DIAGNOSIS — M25562 Pain in left knee: Secondary | ICD-10-CM | POA: Diagnosis not present

## 2019-12-31 DIAGNOSIS — M1712 Unilateral primary osteoarthritis, left knee: Secondary | ICD-10-CM | POA: Diagnosis not present

## 2020-02-12 MED FILL — LEVOTHYROXINE SODIUM 150 MC: 150 | 90 days supply | Qty: 90 | Fill #0

## 2020-04-06 DIAGNOSIS — L853 Xerosis cutis: Secondary | ICD-10-CM | POA: Diagnosis not present

## 2020-04-06 DIAGNOSIS — L918 Other hypertrophic disorders of the skin: Secondary | ICD-10-CM | POA: Diagnosis not present

## 2020-04-27 DIAGNOSIS — Z6835 Body mass index (BMI) 35.0-35.9, adult: Secondary | ICD-10-CM | POA: Diagnosis not present

## 2020-04-27 DIAGNOSIS — J449 Chronic obstructive pulmonary disease, unspecified: Secondary | ICD-10-CM | POA: Diagnosis not present

## 2020-04-27 DIAGNOSIS — J441 Chronic obstructive pulmonary disease with (acute) exacerbation: Secondary | ICD-10-CM | POA: Diagnosis not present

## 2020-05-31 MED FILL — LEVOTHYROXINE SODIUM 150 MC: 150 | 90 days supply | Qty: 90 | Fill #1

## 2020-07-12 DIAGNOSIS — J449 Chronic obstructive pulmonary disease, unspecified: Secondary | ICD-10-CM | POA: Diagnosis not present

## 2020-09-01 ENCOUNTER — Other Ambulatory Visit: Payer: Self-pay | Admitting: Family Medicine

## 2020-09-01 DIAGNOSIS — E039 Hypothyroidism, unspecified: Secondary | ICD-10-CM | POA: Diagnosis not present

## 2020-09-01 DIAGNOSIS — Z13220 Encounter for screening for lipoid disorders: Secondary | ICD-10-CM | POA: Diagnosis not present

## 2020-09-01 DIAGNOSIS — R1310 Dysphagia, unspecified: Secondary | ICD-10-CM | POA: Diagnosis not present

## 2020-09-01 DIAGNOSIS — Z0000 Encounter for general adult medical examination without abnormal findings: Secondary | ICD-10-CM | POA: Diagnosis not present

## 2020-09-06 ENCOUNTER — Ambulatory Visit (INDEPENDENT_AMBULATORY_CARE_PROVIDER_SITE_OTHER): Payer: 59

## 2020-09-06 DIAGNOSIS — E039 Hypothyroidism, unspecified: Secondary | ICD-10-CM | POA: Diagnosis not present

## 2020-09-07 DIAGNOSIS — D351 Benign neoplasm of parathyroid gland: Secondary | ICD-10-CM | POA: Insufficient documentation

## 2020-09-30 ENCOUNTER — Other Ambulatory Visit (HOSPITAL_COMMUNITY): Payer: Self-pay | Admitting: Family Medicine

## 2020-09-30 MED FILL — LEVOTHYROXINE SODIUM 150 MC: 150 | 90 days supply | Qty: 90 | Fill #0

## 2020-10-13 DIAGNOSIS — D442 Neoplasm of uncertain behavior of parathyroid gland: Secondary | ICD-10-CM | POA: Diagnosis not present

## 2020-10-13 DIAGNOSIS — R1310 Dysphagia, unspecified: Secondary | ICD-10-CM | POA: Diagnosis not present

## 2020-10-14 ENCOUNTER — Other Ambulatory Visit: Payer: Self-pay | Admitting: Surgery

## 2020-10-14 DIAGNOSIS — Z780 Asymptomatic menopausal state: Secondary | ICD-10-CM

## 2020-10-14 DIAGNOSIS — Z1231 Encounter for screening mammogram for malignant neoplasm of breast: Secondary | ICD-10-CM | POA: Diagnosis not present

## 2020-10-17 DIAGNOSIS — D442 Neoplasm of uncertain behavior of parathyroid gland: Secondary | ICD-10-CM | POA: Diagnosis not present

## 2020-10-19 ENCOUNTER — Ambulatory Visit (INDEPENDENT_AMBULATORY_CARE_PROVIDER_SITE_OTHER): Payer: 59

## 2020-10-19 ENCOUNTER — Other Ambulatory Visit: Payer: Self-pay

## 2020-10-19 DIAGNOSIS — M8589 Other specified disorders of bone density and structure, multiple sites: Secondary | ICD-10-CM | POA: Diagnosis not present

## 2020-10-19 DIAGNOSIS — Z1329 Encounter for screening for other suspected endocrine disorder: Secondary | ICD-10-CM | POA: Diagnosis not present

## 2020-10-19 DIAGNOSIS — Z780 Asymptomatic menopausal state: Secondary | ICD-10-CM

## 2020-10-19 DIAGNOSIS — R221 Localized swelling, mass and lump, neck: Secondary | ICD-10-CM

## 2020-10-24 ENCOUNTER — Ambulatory Visit: Payer: Self-pay | Admitting: Surgery

## 2020-10-24 NOTE — H&P (Signed)
General Surgery [facility]  Forestine Na DOB: [birth year] Married / Language: English / Race: White Female   History of Present Illness   The patient is a 60 year old female who presents with a parathyroid neoplasm.  CHIEF COMPLAINT: parathyroid neoplasm  Patient is referred by Dr. Melrose Nakayama for surgical evaluation and management of a newly identified neoplasm in the lower left neck suspicious for a parathyroid adenoma. Patient had developed signs and symptoms of dysphagia, mainly with solid foods and thick liquids, which episodes were occurring weekly. She presented to her primary care physician with this complaint. Patient has a long-standing history of hypothyroidism. She is on levothyroxine 137 g daily. Patient was ordered to have an ultrasound examination of the neck which was performed on September 06, 2020. This demonstrated a small heterogeneous thyroid gland without nodules. 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 Therefore the patient is referred to our office for further evaluation. Laboratory studies were performed and showed a serum calcium level of 9.5 and a TSH level of 0.231. Additional imaging or laboratory studies have not been performed. Patient last had a bone density scan approximately 10 years ago. Patient is accompanied by her wife, Dr. Mallie Mussel, from the [facility] family practice Center.   Past Surgical History  Foot Surgery  Left.  Diagnostic Studies History  Colonoscopy  5-10 years ago Pap Smear  >5 years ago  Allergies  Penicillin G Potassium *PENICILLINS*  Allergies Reconciled   Medication History Levothyroxine Sodium (150MCG Tablet, Oral) Active. Pantoprazole Sodium (40MG  Tablet DR, Oral) Active.  Social History Alcohol use  Occasional  alcohol use. No drug use  Tobacco use  Never smoker.  Family History Heart disease in female family member before age 80  Thyroid problems  Sister.  Pregnancy / Birth History  Age at menarche  42 years. Age of menopause  39-50 Gravida  0 Para  0  Other Problems  Thyroid Disease   Review of Systems  General Present- Weight Gain. Not Present- Appetite Loss, Chills, Fatigue, Fever, Night Sweats and Weight Loss. Skin Not Present- Change in Wart/Mole, Dryness, Hives, Jaundice, New Lesions, Non-Healing Wounds, Rash and Ulcer. HEENT Present- Sore Throat. Not Present- Earache, Hearing Loss, Hoarseness, Nose Bleed, Oral Ulcers, Ringing in the Ears, Seasonal Allergies, Sinus Pain, Visual Disturbances, Wears glasses/contact lenses and Yellow Eyes. Respiratory Not Present- Bloody sputum, Chronic Cough, Difficulty Breathing, Snoring and Wheezing. Breast Not Present- Breast Mass, Breast Pain, Nipple Discharge and Skin Changes. Cardiovascular Not Present- Chest Pain, Difficulty Breathing Lying Down, Leg Cramps, Palpitations, Rapid Heart Rate, Shortness of Breath and Swelling of Extremities. Gastrointestinal Present- Difficulty Swallowing. Not Present- Abdominal Pain, Bloating, Bloody Stool, Change in Bowel Habits, Chronic diarrhea, Constipation, Excessive gas, Gets full quickly at meals, Hemorrhoids, Indigestion, Nausea, Rectal Pain and Vomiting. Musculoskeletal Not Present- Back Pain, Joint Pain, Joint Stiffness, Muscle Pain, Muscle Weakness and Swelling of Extremities. Psychiatric Not Present- Anxiety, Bipolar, Change in Sleep Pattern, Depression, Fearful and Frequent crying. Endocrine Not Present- Cold Intolerance, Excessive Hunger, Hair Changes, Heat Intolerance, Hot flashes and New Diabetes. Hematology Not Present- Blood Thinners, Easy Bruising, Excessive bleeding, Gland problems, HIV and Persistent Infections.  Vitals  Weight: 194.5 lb Height: 63in Body Surface Area: 1.91 m Body  Mass Index: 34.45 kg/m  Temp.: 97.30F  Pulse: 72 (Regular)  P.OX: 96% (Room  air) BP: 120/60(Sitting, Left Arm, Standard)  Physical Exam  GENERAL APPEARANCE Development: normal Nutritional status: normal Gross deformities: none  SKIN Rash, lesions, ulcers: none Induration, erythema: none Nodules: none palpable  EYES Conjunctiva and lids: normal Pupils: equal and reactive Iris: normal bilaterally  EARS, NOSE, MOUTH, THROAT External ears: no lesion or deformity External nose: no lesion or deformity Hearing: grossly normal Due to Covid-19 pandemic, patient is wearing a mask.  NECK Symmetric: yes Trachea: midline Thyroid: no palpable nodules in the thyroid bed; mild tenderness to palpation left lower thyroid bed  CHEST Respiratory effort: normal Retraction or accessory muscle use: no Breath sounds: normal bilaterally Rales, rhonchi, wheeze: none  CARDIOVASCULAR Auscultation: regular rhythm, normal rate Murmurs: none Pulses: radial pulse 2+ palpable Lower extremity edema: none  MUSCULOSKELETAL Station and gait: normal Digits and nails: no clubbing or cyanosis Muscle strength: grossly normal all extremities Range of motion: grossly normal all extremities Deformity: none  LYMPHATIC Cervical: none palpable Supraclavicular: none palpable  PSYCHIATRIC Oriented to person, place, and time: yes Mood and affect: normal for situation Judgment and insight: appropriate for situation    Assessment & Plan   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  Patient provided with a copy of "Parathyroid Surgery: Treatment for Your Parathyroid Gland Problem", published  by Krames, 12 pages. Book reviewed and explained to patient during visit today.  Patient has a normal serum calcium level as of December 2021. I would like to obtain additional studies to better evaluate the patient for possible underlying primary hyperparathyroidism. We will obtain a intact PTH level, a 25-hydroxy vitamin D level, and a 24-hour urine collection for calcium. We will also order a bone density scan. Once these studies have returned for evaluation, I will contact the patient with the results.  We discussed possible management strategies. This is an unusual case in that the calcium level is perfectly normal but there does appear to be an enlarged parathyroid gland on ultrasound examination. Patient may end up coming to parathyroidectomy for definitive diagnosis and management. We discussed performing this as an outpatient surgical procedure. This would be a minimally invasive strategy. There is approximately a 95% chance that the patient has single gland involvement. At this point I did not see a reason to proceed with the sestamibi scan.  The patient will undergo the above studies. We will contact her with those results when they are available and make plans for further management at that time.  ADDENDUM  Telephone call to patient and her wife, Dr. Dorcas Mcmurray. Discussed normal lab results, low Vit D level, and osteopenia on bone density scan.  Discussed proceeding with neck exploration and presumed parathyroidectomy as an out-patient procedure at [facility]. Discussed what other possibilities would be for this "mass". They understand and wish to proceed with surgery.  The risks and benefits of the procedure have been discussed at length with the patient. The patient understands the proposed procedure, potential alternative treatments, and the course of recovery to be expected. All of the patient's questions have been answered at this time. The patient wishes to proceed with  surgery.  Will enter orders and send to scheduling.  Armandina Gemma, [place] [facility] Office: [phone]

## 2020-10-24 NOTE — H&P (View-Only) (Signed)
General Surgery [facility]  Doris Aguilar DOB: 05/18/1961 Married / Language: English / Race: White Female   History of Present Illness   The patient is a 60 year old female who presents with a parathyroid neoplasm.  CHIEF COMPLAINT: parathyroid neoplasm  Patient is referred by Dr. Melrose Nakayama for surgical evaluation and management of a newly identified neoplasm in the lower left neck suspicious for a parathyroid adenoma. Patient had developed signs and symptoms of dysphagia, mainly with solid foods and thick liquids, which episodes were occurring weekly. She presented to her primary care physician with this complaint. Patient has a long-standing history of hypothyroidism. She is on levothyroxine 137 g daily. Patient was ordered to have an ultrasound examination of the neck which was performed on September 06, 2020. This demonstrated a small heterogeneous thyroid gland without nodules. 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 Therefore the patient is referred to our office for further evaluation. Laboratory studies were performed and showed a serum calcium level of 9.5 and a TSH level of 0.231. Additional imaging or laboratory studies have not been performed. Patient last had a bone density scan approximately 10 years ago. Patient is accompanied by her wife, Dr. Mallie Mussel, from the [facility] family practice Center.   Past Surgical History  Foot Surgery  Left.  Diagnostic Studies History  Colonoscopy  5-10 years ago Pap Smear  >5 years ago  Allergies  Penicillin G Potassium *PENICILLINS*  Allergies Reconciled   Medication History Levothyroxine Sodium (150MCG Tablet, Oral) Active. Pantoprazole Sodium (40MG  Tablet DR, Oral) Active.  Social History Alcohol use  Occasional  alcohol use. No drug use  Tobacco use  Never smoker.  Family History Heart disease in female family member before age 8  Thyroid problems  Sister.  Pregnancy / Birth History  Age at menarche  10 years. Age of menopause  79-50 Gravida  0 Para  0  Other Problems  Thyroid Disease   Review of Systems  General Present- Weight Gain. Not Present- Appetite Loss, Chills, Fatigue, Fever, Night Sweats and Weight Loss. Skin Not Present- Change in Wart/Mole, Dryness, Hives, Jaundice, New Lesions, Non-Healing Wounds, Rash and Ulcer. HEENT Present- Sore Throat. Not Present- Earache, Hearing Loss, Hoarseness, Nose Bleed, Oral Ulcers, Ringing in the Ears, Seasonal Allergies, Sinus Pain, Visual Disturbances, Wears glasses/contact lenses and Yellow Eyes. Respiratory Not Present- Bloody sputum, Chronic Cough, Difficulty Breathing, Snoring and Wheezing. Breast Not Present- Breast Mass, Breast Pain, Nipple Discharge and Skin Changes. Cardiovascular Not Present- Chest Pain, Difficulty Breathing Lying Down, Leg Cramps, Palpitations, Rapid Heart Rate, Shortness of Breath and Swelling of Extremities. Gastrointestinal Present- Difficulty Swallowing. Not Present- Abdominal Pain, Bloating, Bloody Stool, Change in Bowel Habits, Chronic diarrhea, Constipation, Excessive gas, Gets full quickly at meals, Hemorrhoids, Indigestion, Nausea, Rectal Pain and Vomiting. Musculoskeletal Not Present- Back Pain, Joint Pain, Joint Stiffness, Muscle Pain, Muscle Weakness and Swelling of Extremities. Psychiatric Not Present- Anxiety, Bipolar, Change in Sleep Pattern, Depression, Fearful and Frequent crying. Endocrine Not Present- Cold Intolerance, Excessive Hunger, Hair Changes, Heat Intolerance, Hot flashes and New Diabetes. Hematology Not Present- Blood Thinners, Easy Bruising, Excessive bleeding, Gland problems, HIV and Persistent Infections.  Vitals  Weight: 194.5 lb Height: 63in Body Surface Area: 1.91 m Body  Mass Index: 34.45 kg/m  Temp.: 97.19F  Pulse: 72 (Regular)  P.OX: 96% (Room  air) BP: 120/60(Sitting, Left Arm, Standard)  Physical Exam  GENERAL APPEARANCE Development: normal Nutritional status: normal Gross deformities: none  SKIN Rash, lesions, ulcers: none Induration, erythema: none Nodules: none palpable  EYES Conjunctiva and lids: normal Pupils: equal and reactive Iris: normal bilaterally  EARS, NOSE, MOUTH, THROAT External ears: no lesion or deformity External nose: no lesion or deformity Hearing: grossly normal Due to Covid-19 pandemic, patient is wearing a mask.  NECK Symmetric: yes Trachea: midline Thyroid: no palpable nodules in the thyroid bed; mild tenderness to palpation left lower thyroid bed  CHEST Respiratory effort: normal Retraction or accessory muscle use: no Breath sounds: normal bilaterally Rales, rhonchi, wheeze: none  CARDIOVASCULAR Auscultation: regular rhythm, normal rate Murmurs: none Pulses: radial pulse 2+ palpable Lower extremity edema: none  MUSCULOSKELETAL Station and gait: normal Digits and nails: no clubbing or cyanosis Muscle strength: grossly normal all extremities Range of motion: grossly normal all extremities Deformity: none  LYMPHATIC Cervical: none palpable Supraclavicular: none palpable  PSYCHIATRIC Oriented to person, place, and time: yes Mood and affect: normal for situation Judgment and insight: appropriate for situation    Assessment & Plan   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  Patient provided with a copy of "Parathyroid Surgery: Treatment for Your Parathyroid Gland Problem", published  by Krames, 12 pages. Book reviewed and explained to patient during visit today.  Patient has a normal serum calcium level as of December 2021. I would like to obtain additional studies to better evaluate the patient for possible underlying primary hyperparathyroidism. We will obtain a intact PTH level, a 25-hydroxy vitamin D level, and a 24-hour urine collection for calcium. We will also order a bone density scan. Once these studies have returned for evaluation, I will contact the patient with the results.  We discussed possible management strategies. This is an unusual case in that the calcium level is perfectly normal but there does appear to be an enlarged parathyroid gland on ultrasound examination. Patient may end up coming to parathyroidectomy for definitive diagnosis and management. We discussed performing this as an outpatient surgical procedure. This would be a minimally invasive strategy. There is approximately a 95% chance that the patient has single gland involvement. At this point I did not see a reason to proceed with the sestamibi scan.  The patient will undergo the above studies. We will contact her with those results when they are available and make plans for further management at that time.  ADDENDUM  Telephone call to patient and her wife, Dr. Dorcas Mcmurray. Discussed normal lab results, low Vit D level, and osteopenia on bone density scan.  Discussed proceeding with neck exploration and presumed parathyroidectomy as an out-patient procedure at [facility]. Discussed what other possibilities would be for this "mass". They understand and wish to proceed with surgery.  The risks and benefits of the procedure have been discussed at length with the patient. The patient understands the proposed procedure, potential alternative treatments, and the course of recovery to be expected. All of the patient's questions have been answered at this time. The patient wishes to proceed with  surgery.  Will enter orders and send to scheduling.  Armandina Gemma, [place] [facility] Office: [phone]

## 2020-11-10 NOTE — Patient Instructions (Addendum)
DUE TO COVID-19 ONLY ONE VISITOR IS ALLOWED TO COME WITH YOU AND STAY IN THE WAITING ROOM ONLY DURING PRE OP AND PROCEDURE DAY OF SURGERY. THE 1 VISITOR  MAY VISIT WITH YOU AFTER SURGERY IN YOUR PRIVATE ROOM DURING VISITING HOURS ONLY!  YOU NEED TO HAVE A COVID 19 TEST ON: 11/15/20 @ 8:30 AM , THIS TEST MUST BE DONE BEFORE SURGERY,  COVID TESTING SITE [place] [address], IT IS ON THE RIGHT GOING OUT [address] APPROXIMATELY  2 MINUTES PAST [employer] ON THE RIGHT. ONCE YOUR COVID TEST IS COMPLETED,  PLEASE BEGIN THE QUARANTINE INSTRUCTIONS AS OUTLINED IN YOUR HANDOUT.                Doris Aguilar    Your procedure is scheduled on: 11/18/20   Report to [facility] Main  Entrance   Report to admitting at: 11:00 AM     Call this number if you have problems the morning of surgery [phone]    Remember: Do not eat solid food :After Midnight. Clear liquids until: 10:00 AM  CLEAR LIQUID DIET  Foods Allowed                                                                     Foods Excluded  Coffee and tea, regular and decaf                             liquids that you cannot  Plain Jell-O any favor except red or purple                                           see through such as: Fruit ices (not with fruit pulp)                                     milk, soups, orange juice  Iced Popsicles                                    All solid food Carbonated beverages, regular and diet                                    Cranberry, grape and apple juices Sports drinks like Gatorade Lightly seasoned clear broth or consume(fat free) Sugar, honey syrup  Sample Menu Breakfast                                Lunch                                     Supper Cranberry juice  Beef broth                            Chicken broth Jell-O                                     Grape juice                           Apple juice Coffee or tea                         Jell-O                                      Popsicle                                                Coffee or tea                        Coffee or tea  _____________________________________________________________________  BRUSH YOUR TEETH MORNING OF SURGERY AND RINSE YOUR MOUTH OUT, NO CHEWING GUM CANDY OR MINTS.    Take these medicines the morning of surgery with A SIP OF WATER: synthroid.Loratadine as needed.Use inhalers and flonase as usual.                               You may not have any metal on your body including hair pins and              piercings  Do not wear jewelry, make-up, lotions, powders or perfumes, deodorant             Do not wear nail polish on your fingernails.  Do not shave  48 hours prior to surgery.    Do not bring valuables to the hospital. [place].  Contacts, dentures or bridgework may not be worn into surgery.  Leave suitcase in the car. After surgery it may be brought to your room.     Patients discharged the day of surgery will not be allowed to drive home. IF YOU ARE HAVING SURGERY AND GOING HOME THE SAME DAY, YOU MUST HAVE AN ADULT TO DRIVE YOU HOME AND BE WITH YOU FOR 24 HOURS. YOU MAY GO HOME BY TAXI OR UBER OR ORTHERWISE, BUT AN ADULT MUST ACCOMPANY YOU HOME AND STAY WITH YOU FOR 24 HOURS.  Name and phone number of your driver:  Special Instructions: N/A              Please read over the following fact sheets you were given: _____________________________________________________________________          [facility] - Preparing for Surgery Before surgery, you can play an important role.  Because skin is not sterile, your skin needs to be as free of germs as possible.  You can reduce the number of germs on your skin by washing with CHG (chlorahexidine gluconate) soap  before surgery.  CHG is an antiseptic cleaner which kills germs and bonds with the skin to continue killing germs even after  washing. Please DO NOT use if you have an allergy to CHG or antibacterial soaps.  If your skin becomes reddened/irritated stop using the CHG and inform your nurse when you arrive at Short Stay. Do not shave (including legs and underarms) for at least 48 hours prior to the first CHG shower.  You may shave your face/neck. Please follow these instructions carefully:  1.  Shower with CHG Soap the night before surgery and the  morning of Surgery.  2.  If you choose to wash your hair, wash your hair first as usual with your  normal  shampoo.  3.  After you shampoo, rinse your hair and body thoroughly to remove the  shampoo.                           4.  Use CHG as you would any other liquid soap.  You can apply chg directly  to the skin and wash                       Gently with a scrungie or clean washcloth.  5.  Apply the CHG Soap to your body ONLY FROM THE NECK DOWN.   Do not use on face/ open                           Wound or open sores. Avoid contact with eyes, ears mouth and genitals (private parts).                       Wash face,  Genitals (private parts) with your normal soap.             6.  Wash thoroughly, paying special attention to the area where your surgery  will be performed.  7.  Thoroughly rinse your body with warm water from the neck down.  8.  DO NOT shower/wash with your normal soap after using and rinsing off  the CHG Soap.                9.  Pat yourself dry with a clean towel.            10.  Wear clean pajamas.            11.  Place clean sheets on your bed the night of your first shower and do not  sleep with pets. Day of Surgery : Do not apply any lotions/deodorants the morning of surgery.  Please wear clean clothes to the hospital/surgery center.  FAILURE TO FOLLOW THESE INSTRUCTIONS MAY RESULT IN THE CANCELLATION OF YOUR SURGERY PATIENT SIGNATURE_________________________________  NURSE  SIGNATURE__________________________________  ________________________________________________________________________

## 2020-11-11 ENCOUNTER — Encounter (HOSPITAL_COMMUNITY): Payer: Self-pay

## 2020-11-11 ENCOUNTER — Encounter (HOSPITAL_COMMUNITY)
Admission: RE | Admit: 2020-11-11 | Discharge: 2020-11-11 | Disposition: A | Discharge status: Home / Self Care | Payer: 59 | Source: Ambulatory Visit | Attending: Surgery | Admitting: Surgery

## 2020-11-11 ENCOUNTER — Other Ambulatory Visit: Payer: Self-pay

## 2020-11-11 DIAGNOSIS — Z01812 Encounter for preprocedural laboratory examination: Secondary | ICD-10-CM | POA: Insufficient documentation

## 2020-11-11 HISTORY — DX: Unspecified asthma, uncomplicated: J45.909

## 2020-11-11 LAB — CBC
HCT: 44.2 % (ref 36.0–46.0)
Hemoglobin: 14.4 g/dL (ref 12.0–15.0)
MCH: 28.4 pg (ref 26.0–34.0)
MCHC: 32.6 g/dL (ref 30.0–36.0)
MCV: 87.2 fL (ref 80.0–100.0)
Platelets: 298 10*3/uL (ref 150–400)
RBC: 5.07 MIL/uL (ref 3.87–5.11)
RDW: 13.2 % (ref 11.5–15.5)
WBC: 6.7 10*3/uL (ref 4.0–10.5)
nRBC: 0 % (ref 0.0–0.2)

## 2020-11-11 NOTE — Progress Notes (Signed)
COVID Vaccine Completed: Yes Date COVID Vaccine completed: 11/20/19 COVID vaccine manufacturer:   Moderna     PCP - Dr. Melrose Nakayama Cardiologist -   Chest x-ray -  EKG -  Stress Test -  ECHO -  Cardiac Cath -  Pacemaker/ICD device last checked:  Sleep Study -  CPAP -   Fasting Blood Sugar -  Checks Blood Sugar _____ times a day  Blood Thinner Instructions: Aspirin Instructions: Last Dose:  Anesthesia review:   Patient denies shortness of breath, fever, cough and chest pain at PAT appointment   Patient verbalized understanding of instructions that were given to them at the PAT appointment. Patient was also instructed that they will need to review over the PAT instructions again at home before surgery.

## 2020-11-13 ENCOUNTER — Encounter (HOSPITAL_COMMUNITY): Payer: Self-pay | Admitting: Surgery

## 2020-11-13 DIAGNOSIS — R1310 Dysphagia, unspecified: Secondary | ICD-10-CM

## 2020-11-13 DIAGNOSIS — D442 Neoplasm of uncertain behavior of parathyroid gland: Secondary | ICD-10-CM | POA: Diagnosis present

## 2020-11-15 ENCOUNTER — Other Ambulatory Visit (HOSPITAL_COMMUNITY)
Admission: RE | Admit: 2020-11-15 | Discharge: 2020-11-15 | Disposition: A | Discharge status: Home / Self Care | Payer: 59 | Source: Ambulatory Visit | Attending: Surgery | Admitting: Surgery

## 2020-11-15 DIAGNOSIS — Z20822 Contact with and (suspected) exposure to covid-19: Secondary | ICD-10-CM | POA: Diagnosis not present

## 2020-11-15 DIAGNOSIS — Z01812 Encounter for preprocedural laboratory examination: Secondary | ICD-10-CM | POA: Diagnosis not present

## 2020-11-15 LAB — SARS CORONAVIRUS 2 (TAT 6-24 HRS): SARS Coronavirus 2: NEGATIVE

## 2020-11-18 ENCOUNTER — Encounter (HOSPITAL_COMMUNITY)
Admission: RE | Disposition: A | Discharge status: Home / Self Care | Payer: Self-pay | Source: Home / Self Care | Attending: Surgery

## 2020-11-18 ENCOUNTER — Ambulatory Visit (HOSPITAL_COMMUNITY): Payer: 59 | Admitting: Certified Registered"

## 2020-11-18 ENCOUNTER — Encounter (HOSPITAL_COMMUNITY): Payer: Self-pay | Admitting: Surgery

## 2020-11-18 ENCOUNTER — Other Ambulatory Visit (HOSPITAL_COMMUNITY): Payer: Self-pay | Admitting: Surgery

## 2020-11-18 ENCOUNTER — Ambulatory Visit (HOSPITAL_COMMUNITY)
Admission: RE | Admit: 2020-11-18 | Discharge: 2020-11-18 | Disposition: A | Discharge status: Home / Self Care | Payer: 59 | Attending: Surgery | Admitting: Surgery

## 2020-11-18 DIAGNOSIS — D442 Neoplasm of uncertain behavior of parathyroid gland: Secondary | ICD-10-CM | POA: Diagnosis not present

## 2020-11-18 DIAGNOSIS — M8580 Other specified disorders of bone density and structure, unspecified site: Secondary | ICD-10-CM | POA: Diagnosis not present

## 2020-11-18 DIAGNOSIS — Z8249 Family history of ischemic heart disease and other diseases of the circulatory system: Secondary | ICD-10-CM | POA: Diagnosis not present

## 2020-11-18 DIAGNOSIS — Z8349 Family history of other endocrine, nutritional and metabolic diseases: Secondary | ICD-10-CM | POA: Insufficient documentation

## 2020-11-18 DIAGNOSIS — E041 Nontoxic single thyroid nodule: Secondary | ICD-10-CM | POA: Insufficient documentation

## 2020-11-18 DIAGNOSIS — R1310 Dysphagia, unspecified: Secondary | ICD-10-CM

## 2020-11-18 DIAGNOSIS — E039 Hypothyroidism, unspecified: Secondary | ICD-10-CM | POA: Insufficient documentation

## 2020-11-18 DIAGNOSIS — Z79890 Hormone replacement therapy (postmenopausal): Secondary | ICD-10-CM | POA: Diagnosis not present

## 2020-11-18 DIAGNOSIS — Z79899 Other long term (current) drug therapy: Secondary | ICD-10-CM | POA: Insufficient documentation

## 2020-11-18 DIAGNOSIS — J45909 Unspecified asthma, uncomplicated: Secondary | ICD-10-CM | POA: Diagnosis not present

## 2020-11-18 DIAGNOSIS — Z880 Allergy status to penicillin: Secondary | ICD-10-CM | POA: Insufficient documentation

## 2020-11-18 HISTORY — PX: PARATHYROIDECTOMY: SHX19

## 2020-11-18 SURGERY — PARATHYROIDECTOMY
Anesthesia: General | Site: Neck | Laterality: Left

## 2020-11-18 MED ORDER — ONDANSETRON HCL 4 MG/2ML IJ SOLN
INTRAMUSCULAR | Status: AC
  Filled 2020-11-18: qty 2

## 2020-11-18 MED ORDER — FENTANYL CITRATE (PF) 100 MCG/2ML IJ SOLN
INTRAMUSCULAR | Status: AC
  Filled 2020-11-18: qty 2

## 2020-11-18 MED ORDER — CHLORHEXIDINE GLUCONATE CLOTH 2 % EX PADS: 6.0000 | MEDICATED_PAD | Freq: Once | CUTANEOUS | Status: DC

## 2020-11-18 MED ORDER — 0.9 % SODIUM CHLORIDE (POUR BTL) OPTIME
TOPICAL | Status: DC | PRN
  Administered 2020-11-18: 1000 mL

## 2020-11-18 MED ORDER — DEXAMETHASONE SODIUM PHOSPHATE 10 MG/ML IJ SOLN
INTRAMUSCULAR | Status: AC
  Filled 2020-11-18: qty 1

## 2020-11-18 MED ORDER — MIDAZOLAM HCL 2 MG/2ML IJ SOLN
INTRAMUSCULAR | Status: DC | PRN
  Administered 2020-11-18: 2 mg via INTRAVENOUS

## 2020-11-18 MED ORDER — ONDANSETRON HCL 4 MG/2ML IJ SOLN
INTRAMUSCULAR | Status: DC | PRN
  Administered 2020-11-18: 4 mg via INTRAVENOUS

## 2020-11-18 MED ORDER — BUPIVACAINE HCL 0.25 % IJ SOLN
INTRAMUSCULAR | Status: AC
  Filled 2020-11-18: qty 1

## 2020-11-18 MED ORDER — PROMETHAZINE HCL 25 MG/ML IJ SOLN: 6.2500 mg | INTRAMUSCULAR | Status: DC | PRN

## 2020-11-18 MED ORDER — OXYCODONE HCL 5 MG/5ML PO SOLN: 5.0000 mg | Freq: Once | ORAL | Status: AC | PRN

## 2020-11-18 MED ORDER — DEXAMETHASONE SODIUM PHOSPHATE 10 MG/ML IJ SOLN
INTRAMUSCULAR | Status: DC | PRN
  Administered 2020-11-18: 8 mg via INTRAVENOUS

## 2020-11-18 MED ORDER — CHLORHEXIDINE GLUCONATE 0.12 % MT SOLN: 15.0000 mL | Freq: Once | OROMUCOSAL | Status: DC

## 2020-11-18 MED ORDER — LIDOCAINE 2% (20 MG/ML) 5 ML SYRINGE
INTRAMUSCULAR | Status: DC | PRN
  Administered 2020-11-18: 60 mg via INTRAVENOUS

## 2020-11-18 MED ORDER — ROCURONIUM BROMIDE 10 MG/ML (PF) SYRINGE
PREFILLED_SYRINGE | INTRAVENOUS | Status: AC
  Filled 2020-11-18: qty 10

## 2020-11-18 MED ORDER — BUPIVACAINE HCL 0.25 % IJ SOLN
INTRAMUSCULAR | Status: DC | PRN
  Administered 2020-11-18: 10 mL

## 2020-11-18 MED ORDER — OXYCODONE HCL 5 MG PO TABS
ORAL_TABLET | ORAL | Status: AC
  Filled 2020-11-18: qty 1

## 2020-11-18 MED ORDER — CIPROFLOXACIN IN D5W 400 MG/200ML IV SOLN
INTRAVENOUS | Status: AC
  Filled 2020-11-18: qty 200

## 2020-11-18 MED ORDER — PROPOFOL 10 MG/ML IV BOLUS
INTRAVENOUS | Status: DC | PRN
  Administered 2020-11-18: 170 mg via INTRAVENOUS

## 2020-11-18 MED ORDER — LIDOCAINE 2% (20 MG/ML) 5 ML SYRINGE
INTRAMUSCULAR | Status: AC
  Filled 2020-11-18: qty 5

## 2020-11-18 MED ORDER — SUGAMMADEX SODIUM 200 MG/2ML IV SOLN
INTRAVENOUS | Status: DC | PRN
  Administered 2020-11-18: 200 mg via INTRAVENOUS

## 2020-11-18 MED ORDER — MIDAZOLAM HCL 2 MG/2ML IJ SOLN
INTRAMUSCULAR | Status: AC
  Filled 2020-11-18: qty 2

## 2020-11-18 MED ORDER — TRAMADOL HCL 50 MG PO TABS: 50.0000 mg | ORAL_TABLET | Freq: Four times a day (QID) | ORAL | 0 refills | Status: DC | PRN

## 2020-11-18 MED ORDER — PROPOFOL 10 MG/ML IV BOLUS
INTRAVENOUS | Status: AC
  Filled 2020-11-18: qty 20

## 2020-11-18 MED ORDER — CIPROFLOXACIN IN D5W 400 MG/200ML IV SOLN
400.0000 mg | INTRAVENOUS | Status: AC
  Administered 2020-11-18: 400 mg via INTRAVENOUS

## 2020-11-18 MED ORDER — OXYCODONE HCL 5 MG PO TABS
5.0000 mg | ORAL_TABLET | Freq: Once | ORAL | Status: AC | PRN
  Administered 2020-11-18: 5 mg via ORAL

## 2020-11-18 MED ORDER — ORAL CARE MOUTH RINSE: 15.0000 mL | Freq: Once | OROMUCOSAL | Status: DC

## 2020-11-18 MED ORDER — ROCURONIUM BROMIDE 10 MG/ML (PF) SYRINGE
PREFILLED_SYRINGE | INTRAVENOUS | Status: DC | PRN
  Administered 2020-11-18: 50 mg via INTRAVENOUS

## 2020-11-18 MED ORDER — FENTANYL CITRATE (PF) 250 MCG/5ML IJ SOLN
INTRAMUSCULAR | Status: DC | PRN
  Administered 2020-11-18 (×2): 50 ug via INTRAVENOUS
  Administered 2020-11-18 (×2): 25 ug via INTRAVENOUS
  Administered 2020-11-18: 50 ug via INTRAVENOUS

## 2020-11-18 MED ORDER — LACTATED RINGERS IV SOLN: INTRAVENOUS | Status: DC

## 2020-11-18 MED ORDER — FENTANYL CITRATE (PF) 100 MCG/2ML IJ SOLN
25.0000 ug | INTRAMUSCULAR | Status: DC | PRN
  Administered 2020-11-18 (×2): 50 ug via INTRAVENOUS

## 2020-11-18 MED FILL — traMADol HCL 50 MG TABS: 50 | 2 days supply | Qty: 15 | Fill #0

## 2020-11-18 SURGICAL SUPPLY — 33 items
ADH SKN CLS APL DERMABOND .7 (GAUZE/BANDAGES/DRESSINGS) ×1
APL PRP STRL LF DISP 70% ISPRP (MISCELLANEOUS) ×1
ATTRACTOMAT 16X20 MAGNETIC DRP (DRAPES) ×2 IMPLANT
BLADE SURG 15 STRL LF DISP TIS (BLADE) ×1 IMPLANT
BLADE SURG 15 STRL SS (BLADE) ×2
CHLORAPREP W/TINT 26 (MISCELLANEOUS) ×2 IMPLANT
CLIP VESOCCLUDE MED 6/CT (CLIP) ×4 IMPLANT
CLIP VESOCCLUDE SM WIDE 6/CT (CLIP) ×4 IMPLANT
COVER SURGICAL LIGHT HANDLE (MISCELLANEOUS) ×2 IMPLANT
COVER WAND RF STERILE (DRAPES) ×2 IMPLANT
DERMABOND ADVANCED (GAUZE/BANDAGES/DRESSINGS) ×1
DERMABOND ADVANCED .7 DNX12 (GAUZE/BANDAGES/DRESSINGS) ×1 IMPLANT
DRAPE LAPAROTOMY T 98X78 PEDS (DRAPES) ×2 IMPLANT
DRAPE UTILITY XL STRL (DRAPES) ×2 IMPLANT
ELECT REM PT RETURN 15FT ADLT (MISCELLANEOUS) ×2 IMPLANT
GAUZE 4X4 16PLY RFD (DISPOSABLE) ×2 IMPLANT
GLOVE SURG ORTHO LTX SZ8 (GLOVE) ×2 IMPLANT
GOWN STRL REUS W/TWL XL LVL3 (GOWN DISPOSABLE) ×6 IMPLANT
HEMOSTAT SURGICEL 2X4 FIBR (HEMOSTASIS) ×2 IMPLANT
ILLUMINATOR WAVEGUIDE N/F (MISCELLANEOUS) IMPLANT
KIT BASIN OR (CUSTOM PROCEDURE TRAY) ×2 IMPLANT
KIT TURNOVER KIT A (KITS) ×2 IMPLANT
NDL HYPO 25X1 1.5 SAFETY (NEEDLE) ×1 IMPLANT
NEEDLE HYPO 25X1 1.5 SAFETY (NEEDLE) ×2 IMPLANT
PACK BASIC VI WITH GOWN DISP (CUSTOM PROCEDURE TRAY) ×2 IMPLANT
PENCIL SMOKE EVACUATOR (MISCELLANEOUS) ×2 IMPLANT
SUT MNCRL AB 4-0 PS2 18 (SUTURE) ×2 IMPLANT
SUT VIC AB 3-0 SH 18 (SUTURE) ×2 IMPLANT
SYR BULB IRRIG 60ML STRL (SYRINGE) ×2 IMPLANT
SYR CONTROL 10ML LL (SYRINGE) ×2 IMPLANT
TOWEL OR 17X26 10 PK STRL BLUE (TOWEL DISPOSABLE) ×2 IMPLANT
TOWEL OR NON WOVEN STRL DISP B (DISPOSABLE) ×2 IMPLANT
TUBING CONNECTING 10 (TUBING) ×2 IMPLANT

## 2020-11-18 NOTE — Anesthesia Preprocedure Evaluation (Addendum)
Anesthesia Evaluation  Patient identified by MRN, date of birth, ID band Patient awake    Reviewed: Allergy & Precautions, NPO status , Patient's Chart, lab work & pertinent test results  History of Anesthesia Complications Negative for: history of anesthetic complications  Airway Mallampati: II  TM Distance: >3 FB Neck ROM: Full    Dental  (+) Dental Advisory Given   Pulmonary asthma ,    Pulmonary exam normal        Cardiovascular negative cardio ROS Normal cardiovascular exam     Neuro/Psych negative neurological ROS  negative psych ROS   GI/Hepatic Neg liver ROS, GERD (resolved with weight loss)  Controlled,  Endo/Other  Hypothyroidism  Parathyroid neoplasm   Renal/GU negative Renal ROS     Musculoskeletal  (+) Arthritis ,   Abdominal   Peds  Hematology negative hematology ROS (+)   Anesthesia Other Findings Covid test negative   Reproductive/Obstetrics                            Anesthesia Physical Anesthesia Plan  ASA: II  Anesthesia Plan: General   Post-op Pain Management:    Induction: Intravenous  PONV Risk Score and Plan: 3 and Treatment may vary due to age or medical condition, Ondansetron, Dexamethasone and Midazolam  Airway Management Planned: Oral ETT  Additional Equipment: None  Intra-op Plan:   Post-operative Plan: Extubation in OR  Informed Consent: I have reviewed the patients History and Physical, chart, labs and discussed the procedure including the risks, benefits and alternatives for the proposed anesthesia with the patient or authorized representative who has indicated his/her understanding and acceptance.     Dental advisory given  Plan Discussed with: CRNA and Anesthesiologist  Anesthesia Plan Comments:        Anesthesia Quick Evaluation

## 2020-11-18 NOTE — Discharge Instructions (Signed)
[facility]  THYROID & PARATHYROID SURGERY:  POST-OP INSTRUCTIONS  Always review your discharge instruction sheet from the facility where your surgery was performed.  A prescription for pain medication may be given to you upon discharge.  Take your pain medication as prescribed.  If narcotic pain medicine is not needed, then you may take acetaminophen (Tylenol) or ibuprofen (Advil) as needed.  Take your usually prescribed medications unless otherwise directed.  If you need a refill on your pain medication, please contact our office during regular business hours.  Prescriptions cannot be processed by our office after 5 pm or on weekends.  Start with a light diet upon arrival home, such as soup and crackers or toast.  Be sure to drink plenty of fluids daily.  Resume your normal diet the day after surgery.  Most patients will experience some swelling and bruising on the chest and neck area.  Ice packs will help.  Swelling and bruising can take several days to resolve.   It is common to experience some constipation after surgery.  Increasing fluid intake and taking a stool softener (Colace) will usually help or prevent this problem.  A mild laxative (Milk of Magnesia or Miralax) should be taken according to package directions if there has been no bowel movement after 48 hours.  You have steri-strips and a gauze dressing over your incision.  You may remove the gauze bandage on the second day after surgery, and you may shower at that time.  Leave your steri-strips (small skin tapes) in place directly over the incision.  These strips should remain on the skin for 5-7 days and then be removed.  You may get them wet in the shower and pat them dry.  You may resume regular (light) daily activities beginning the next day (such as daily self-care, walking, climbing stairs) gradually increasing activities as tolerated.  You may have sexual intercourse when it is comfortable.  Refrain from  any heavy lifting or straining until approved by your doctor.  You may drive when you no longer are taking prescription pain medication, you can comfortably wear a seatbelt, and you can safely maneuver your car and apply brakes.  You should see your doctor in the office for a follow-up appointment approximately three weeks after your surgery.  Make sure that you call for this appointment within a day or two after you arrive home to insure a convenient appointment time.  WHEN TO CALL YOUR DOCTOR: -- Fever greater than 101.5 -- Inability to urinate -- Nausea and/or vomiting - persistent -- Extreme swelling or bruising -- Continued bleeding from incision -- Increased pain, redness, or drainage from the incision -- Difficulty swallowing or breathing -- Muscle cramping or spasms -- Numbness or tingling in hands or around lips  The clinic staff is available to answer your questions during regular business hours.  Please don't hesitate to call and ask to speak to one of the nurses if you have concerns.  Kaimana Neuzil, MD [facility] Office: [phone] 

## 2020-11-18 NOTE — Anesthesia Procedure Notes (Signed)
Procedure Name: Intubation Date/Time: 11/18/2020 12:41 PM Performed by: Victoriano Lain, CRNA Pre-anesthesia Checklist: Patient identified, Emergency Drugs available, Suction available, Patient being monitored and Timeout performed Patient Re-evaluated:Patient Re-evaluated prior to induction Oxygen Delivery Method: Circle system utilized Preoxygenation: Pre-oxygenation with 100% oxygen Induction Type: IV induction Ventilation: Mask ventilation without difficulty Laryngoscope Size: Mac and 4 Grade View: Grade I Tube type: Oral Tube size: 7.0 mm Number of attempts: 1 Airway Equipment and Method: Stylet Placement Confirmation: ETT inserted through vocal cords under direct vision,  positive ETCO2 and breath sounds checked- equal and bilateral Secured at: 22 cm Tube secured with: Tape Dental Injury: Teeth and Oropharynx as per pre-operative assessment

## 2020-11-18 NOTE — Anesthesia Postprocedure Evaluation (Signed)
Anesthesia Post Note  Patient: Doris Aguilar  Procedure(s) Performed: Excision of exophytic thyroid nodule (Left Neck)     Patient location during evaluation: PACU Anesthesia Type: General Level of consciousness: awake and alert Pain management: pain level controlled Vital Signs Assessment: post-procedure vital signs reviewed and stable Respiratory status: spontaneous breathing, nonlabored ventilation, respiratory function stable and patient connected to nasal cannula oxygen Cardiovascular status: blood pressure returned to baseline and stable Postop Assessment: no apparent nausea or vomiting Anesthetic complications: no   No complications documented.  Last Vitals:  Vitals:   11/18/20 1445 11/18/20 1500  BP:  (!) 159/98  Pulse: 80 78  Resp: 15 16  Temp:    SpO2: 100% 97%    Last Pain:  Vitals:   11/18/20 1500  TempSrc:   PainSc: [place]

## 2020-11-18 NOTE — Interval H&P Note (Signed)
History and Physical Interval Note:  11/18/2020 12:18 PM  Doris Aguilar  has presented today for surgery, with the diagnosis of neoplasm of uncertain behavior parathyroid gland.  The various methods of treatment have been discussed with the patient and family. After consideration of risks, benefits and other options for treatment, the patient has consented to    Procedure(s) with comments: left inferior PARATHYROIDECTOMY (Left) - 75 min as a surgical intervention.    The patient's history has been reviewed, patient examined, no change in status, stable for surgery.  I have reviewed the patient's chart and labs.  Questions were answered to the patient's satisfaction.    Armandina Gemma, MD [facility] Office: [place]

## 2020-11-18 NOTE — Transfer of Care (Signed)
Immediate Anesthesia Transfer of Care Note  Patient: Doris Aguilar  Procedure(s) Performed: Excision of exophytic thyroid nodule (Left Neck)  Patient Location: PACU  Anesthesia Type:General  Level of Consciousness: awake, drowsy and patient cooperative  Airway & Oxygen Therapy: Patient Spontanous Breathing and Patient connected to face mask oxygen  Post-op Assessment: Report given to RN and Post -op Vital signs reviewed and stable  Post vital signs: Reviewed and stable  Last Vitals:  Vitals Value Taken Time  BP 154/103 11/18/20 1412  Temp    Pulse 87 11/18/20 1414  Resp 14 11/18/20 1414  SpO2 100 % 11/18/20 1414  Vitals shown include unvalidated device data.  Last Pain:  Vitals:   11/18/20 1120  TempSrc: Oral         Complications: No complications documented.

## 2020-11-18 NOTE — Op Note (Signed)
Operative Note  Pre-operative Diagnosis:  Parathyroid neoplasm of uncertain behavior  Post-operative Diagnosis:  Exophytic thyroid nodule  Surgeon:  Armandina Gemma, MD  Assistant:  none   Procedure:  Neck exploration and excision of exophytic thyroid nodule  Anesthesia:  general  Estimated Blood Loss:  minimal  Drains: none         Specimen: nodule to pathology frozen section and permanent evaluation  Indications:  Patient is referred by Dr. Melrose Nakayama for surgical evaluation and management of a newly identified neoplasm in the lower left neck suspicious for a parathyroid adenoma. Patient had developed signs and symptoms of dysphagia, mainly with solid foods and thick liquids, which episodes were occurring weekly. She presented to her primary care physician with this complaint. Patient has a long-standing history of hypothyroidism. She is on levothyroxine 137 g daily. Patient was ordered to have an ultrasound examination of the neck which was performed on September 06, 2020. This demonstrated a small heterogeneous thyroid gland without nodules. 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 Therefore the patient is referred to our office for further evaluation. Laboratory studies were performed and showed a serum calcium level of 9.5 and a TSH level of 0.231.  Patient now comes to surgery for excision of mass presumed to be a parathyroid neoplasm.  Procedure:  The patient was seen in the pre-op holding area. The risks, benefits, complications, treatment options, and expected outcomes were previously discussed with the patient. The patient agreed with the proposed plan and has signed the informed consent form.  The patient was brought to the operating room by the surgical team, identified as Harle Battiest and the  procedure verified. A "time out" was completed and the above information confirmed.  Following induction of general anesthesia, the patient was positioned and then prepped and draped in the usual aseptic fashion.  After ascertaining that an adequate level of anesthesia been achieved, a left anterior neck incision is made with a #15 blade.  Dissection was carried through subcutaneous tissues and platysma.  Hemostasis is achieved with the electrocautery.  Skin flaps are elevated circumferentially and a Weitlander retractor placed for exposure.  Strap muscles are incised in the midline and retracted to the left exposing the inferior pole the left thyroid lobe.  Inferior pole of the left lobe was mobilized.  Venous tributaries were divided between small ligaclips.  Dissection posteriorly identifies an enlarged nodular mass lying on the precervical fascia adjacent to the esophagus and next to the carotid artery.  This matches the mass seen on ultrasound examination.  It is gently mobilized.  It has the appearance of an enlarged parathyroid gland.  It tracks upwards and towards the posterior lateral aspect of the thyroid gland.  There appears to be a clear plane between this mass and the normal thyroid parenchyma.  Great care is taken to avoid the recurrent laryngeal nerve and the esophagus.  Tissue is dissected down to a small vascular pedicle which is divided between small ligaclips sharply with the Metzenbaum scissors.  Specimen is submitted to pathology for review.  Frozen section biopsy confirms normal thyroid tissue.  No parathyroid tissue is identified.  This would be consistent with an exophytic thyroid nodule.  This would also fit the biochemical results noted.  Neck is irrigated with warm saline.  Good hemostasis is achieved.  Fibrillar  is placed throughout the operative field.  Strap muscles are reapproximated in the midline with interrupted 3-0 Vicryl sutures.  Platysma was closed with interrupted 3-0  Vicryl sutures.  Skin is anesthetized with local anesthetic.  Skin edges are reapproximated with a running 4-0 Monocryl subcuticular suture.  Wound is washed and dried and Dermabond is applied as dressing.  Patient is awakened from anesthesia and transported to the recovery room.  The patient tolerated the procedure well.   Armandina Gemma, MD [facility] Office: [phone]

## 2020-11-19 ENCOUNTER — Encounter (HOSPITAL_COMMUNITY): Payer: Self-pay | Admitting: Surgery

## 2020-11-21 LAB — SURGICAL PATHOLOGY

## 2021-01-08 MED FILL — Levothyroxine Sodium Tab 150 MCG: ORAL | 90 days supply | Qty: 90 | Fill #0 | Status: AC

## 2021-01-09 ENCOUNTER — Other Ambulatory Visit (HOSPITAL_COMMUNITY): Payer: Self-pay

## 2021-02-01 ENCOUNTER — Other Ambulatory Visit (HOSPITAL_COMMUNITY): Payer: Self-pay

## 2021-02-01 MED ORDER — PANTOPRAZOLE SODIUM 40 MG PO TBEC
40.0000 mg | DELAYED_RELEASE_TABLET | Freq: Every day | ORAL | 3 refills | Status: DC
  Filled 2021-02-01: qty 90, 90d supply, fill #0

## 2021-02-14 ENCOUNTER — Encounter: Payer: Self-pay | Admitting: Family Medicine

## 2021-02-14 ENCOUNTER — Telehealth (INDEPENDENT_AMBULATORY_CARE_PROVIDER_SITE_OTHER): Payer: 59 | Admitting: Family Medicine

## 2021-02-14 ENCOUNTER — Other Ambulatory Visit: Payer: Self-pay

## 2021-02-14 ENCOUNTER — Telehealth: Payer: Self-pay

## 2021-02-14 DIAGNOSIS — M25571 Pain in right ankle and joints of right foot: Secondary | ICD-10-CM | POA: Diagnosis not present

## 2021-02-14 DIAGNOSIS — R635 Abnormal weight gain: Secondary | ICD-10-CM | POA: Diagnosis not present

## 2021-02-14 DIAGNOSIS — R600 Localized edema: Secondary | ICD-10-CM

## 2021-02-14 NOTE — Progress Notes (Addendum)
[name]  Patient consented to have virtual visit and was identified by name and date of birth. Method of visit: Video used for more than 50% of visit   Encounter participants: Patient: Doris Aguilar - located at home Provider: Martyn Malay - located at [facility] Others (if applicable): none  Chief Complaint: right ankle and calf pain and swelling   HPI:  Doris Aguilar is a 60 year old woman with history significant for hypothyroidism and recent ectopic thyroid removal in March presenting today with 3 weeks of a painful right swollen leg.  This began 2 to 3 weeks ago and started in her R ankle. This has worsened with be associated with calf pain and swelling.  She has measured her legs and they are a little less than an inch in difference with the right being greater than the left.  She has no known injury. Ice does improve swelling. She has tried compression stockings (wearing today) without much improvement.  She does wear hiking boots when she is away for the weekend.  She has pain most of the way up her leg.  She reports a family history of recurrent phlebitis in her mother.  She denies chest pain dyspnea or prior history of venous thromboembolism.  No overlying redness or signs of infection. She is a non-smoker.  She does endorse fatigue and undesired weight gain since COVID-19.  ROS: per HPI  Pertinent PMHx: Reviewed- no estrogen containing therapies, no personal history VTE, surgery was in March   Exam:  LMP 05/23/2011   Right ankle without erythema but with some 1-2+ pitting edema and asymmetry of the lower extremities  Assessment/Plan:  Right Calf and Ankle Pain given her relatively recent onset and calf pain and difference in size will obtain DVT ultrasound of the right leg.  Nursing to call to schedule either at [facility] location if unable to schedule at [place] could schedule [facility].  Given this started in ankle  suspicious for posterior tibial tendinopathy or osteoarthritis of the right ankle.  Will obtain x-ray as well.  Continue to ice.  I gave return precautions for symptoms of venous thromboembolism of the leg as well as pulmonary embolism which included chest pain, difficulty breathing, or an elevated heart rate.  She will monitor for the symptoms.  All questions were answered   For undesired weight gain referral to healthy weight and wellness.  Time spent during visit with patient: 14 minutes

## 2021-02-14 NOTE — Addendum Note (Signed)
Addended by: Owens Shark, Shantera Monts on: 02/14/2021 06:51 PM   Modules accepted: Orders

## 2021-02-14 NOTE — Telephone Encounter (Signed)
Called [facility] in [place] to attempt to schedule DVT US. Technician informed that there will not be an ultrasound technician to perform this until Friday, 6/10. States that they have been referring patients to have ultrasounds performed at [facility].   Called [facility] in [place]. Received VM for scheduling. Left VM for returned phone call to office to schedule DVT ultrasound.   Talbot Grumbling, RN

## 2021-02-14 NOTE — Addendum Note (Signed)
Addended by: Owens Shark, Rylah Fukuda on: 02/14/2021 04:37 PM   Modules accepted: Orders

## 2021-02-14 NOTE — Addendum Note (Signed)
Addended by: Owens Shark, Jury Caserta on: 02/14/2021 07:09 PM   Modules accepted: Orders

## 2021-02-14 NOTE — Telephone Encounter (Signed)
Patient scheduled for tomorrow morning at [facility] for 8am. Arrival at [place].   Called patient and provided with instructions. Patient verbalizes understanding.   Talbot Grumbling, RN

## 2021-02-15 ENCOUNTER — Ambulatory Visit (INDEPENDENT_AMBULATORY_CARE_PROVIDER_SITE_OTHER): Payer: 59

## 2021-02-15 ENCOUNTER — Ambulatory Visit (HOSPITAL_COMMUNITY)
Admission: RE | Admit: 2021-02-15 | Discharge: 2021-02-15 | Disposition: A | Discharge status: Home / Self Care | Payer: 59 | Source: Ambulatory Visit | Attending: Family Medicine | Admitting: Family Medicine

## 2021-02-15 ENCOUNTER — Other Ambulatory Visit: Payer: Self-pay

## 2021-02-15 DIAGNOSIS — R600 Localized edema: Secondary | ICD-10-CM | POA: Diagnosis not present

## 2021-02-15 DIAGNOSIS — M7989 Other specified soft tissue disorders: Secondary | ICD-10-CM | POA: Diagnosis not present

## 2021-02-15 DIAGNOSIS — M25571 Pain in right ankle and joints of right foot: Secondary | ICD-10-CM | POA: Diagnosis not present

## 2021-02-15 NOTE — Addendum Note (Signed)
Addended by: Owens Shark, Sahand Gosch on: 02/15/2021 08:40 AM   Modules accepted: Orders

## 2021-02-15 NOTE — Progress Notes (Signed)
Right lower ext venous duplex  has been completed. Refer to [facility] under chart review to view preliminary results.   02/15/2021  8:18 AM Hanin Decook, Bonnye Fava

## 2021-02-16 ENCOUNTER — Telehealth: Payer: Self-pay | Admitting: Family Medicine

## 2021-02-16 NOTE — Telephone Encounter (Signed)
Left generic voicemail to call back about results.

## 2021-02-16 NOTE — Telephone Encounter (Signed)
Called with results. Referred for ankle ultrasound at [facility].  Dorris Singh, MD  Family Medicine Teaching Service

## 2021-02-17 ENCOUNTER — Ambulatory Visit: Payer: Self-pay

## 2021-02-17 ENCOUNTER — Other Ambulatory Visit: Payer: Self-pay

## 2021-02-17 ENCOUNTER — Encounter: Payer: Self-pay | Admitting: Family Medicine

## 2021-02-17 ENCOUNTER — Ambulatory Visit: Payer: 59 | Admitting: Family Medicine

## 2021-02-17 DIAGNOSIS — M25572 Pain in left ankle and joints of left foot: Secondary | ICD-10-CM

## 2021-02-17 DIAGNOSIS — M25571 Pain in right ankle and joints of right foot: Secondary | ICD-10-CM | POA: Diagnosis not present

## 2021-02-17 DIAGNOSIS — G8929 Other chronic pain: Secondary | ICD-10-CM

## 2021-02-17 NOTE — Patient Instructions (Signed)
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 We made you new orthotics today - break these in as we discussed but this is the most important part of your treatment. Avoid flat shoes, barefoot walking as much as possible. Icing 15 minutes at a time 3-4 times a day. Aleve 2 tabs twice a day with food for 7 days then as needed. Compression when you can tolerate this. Elevate above your heart when possible. Let me know how you're doing in a week either through [place] or send me a mychart message - I'll be here Thursday and Friday then the following week too.

## 2021-02-17 NOTE — Progress Notes (Signed)
PCP: Montez Morita, MD  Subjective:   HPI: Patient is a 60 y.o. female here for right ankle pain.  Patient reports about 2-3 weeks ago she developed insidious onset medial right ankle pain. No acute injury or trauma. No redness though had some warmth and swelling medial ankle up medial lower leg. Pain is a shooting and at onset couldn't even let skin touch the sheets. Has been elevating, icing, taking ibuprofen without much help. Had radiographs and doppler u/s that were negative. No pain with toe movements.  Past Medical History:  Diagnosis Date   Acid reflux    prior to weight loss, but none since loss of 60 lbs.    Arthritis    hands   Asthma    Hypothyroidism    Osteopenia 02/12/11   T score -1.7 of femoral neck, 0.0 of LS spine   Seasonal allergies    Thyroid disease     Current Outpatient Medications on File Prior to Visit  Medication Sig Dispense Refill   albuterol (VENTOLIN HFA) 108 (90 Base) MCG/ACT inhaler TAKE 2 PUFFS BY MOUTH EVERY 6 HOURS AS NEEDED FOR WHEEZE OR SHORTNESS OF BREATH (Patient taking differently: Inhale 2 puffs into the lungs every 6 (six) hours as needed for wheezing or shortness of breath.) 8.5 g 1   CALCIUM PO Take 3 each by mouth daily.     cholecalciferol (VITAMIN D) 25 MCG (1000 UNIT) tablet Take 1,000 Units by mouth daily.     fluticasone (FLONASE) 50 MCG/ACT nasal spray Place 1 spray into the nose daily as needed for allergies.     levothyroxine (SYNTHROID) 150 MCG tablet TAKE ONE TABLET (150 MCG DOSE) BY MOUTH DAILY. 90 tablet 3   levothyroxine (SYNTHROID, LEVOTHROID) 137 MCG tablet Take 137 mcg by mouth See admin instructions. 6 days a week  3   loratadine (CLARITIN) 10 MG tablet Take 10 mg by mouth daily as needed for allergies.     Multiple Vitamin (MULTIVITAMIN) tablet Take 1 tablet by mouth daily. Womens 50+ daily     naproxen sodium (ANAPROX) 220 MG tablet Take 220 mg by mouth daily as needed (pain).     pantoprazole (PROTONIX) 40 MG  tablet Take 1 tablet (40 mg total) by mouth daily. 90 tablet 3   traMADol (ULTRAM) 50 MG tablet TAKE 1-2 TABLETS (50-100 MG TOTAL) BY MOUTH EVERY 6 (SIX) HOURS AS NEEDED FOR MODERATE PAIN. 15 tablet 0   No current facility-administered medications on file prior to visit.    Past Surgical History:  Procedure Laterality Date   dog bite     1982- multiple surgeries & debridement   FINGER CLOSED REDUCTION Left 05/14/2015   Procedure: CLOSED REDUCTION  internal fixatuon left thumb metacarpal fracture;  Surgeon: Roseanne Kaufman, MD;  Location: [facility];  Service: Orthopedics;  Laterality: Left;   PARATHYROIDECTOMY Left 11/18/2020   Procedure: Excision of exophytic thyroid nodule;  Surgeon: Armandina Gemma, MD;  Location: [facility];  Service: General;  Laterality: Left;  75 min   TENDON RECONSTRUCTION     left foot March 2015   TONSILLECTOMY      Allergies  Allergen Reactions   Penicillins     REACTION: Thinks it was itching.    Social History   Socioeconomic History   Marital status: Married    Spouse name: Not on file   Number of children: Not on file   Years of education: Not on file   Highest education level: Not on file  Occupational History   Not on file  Tobacco Use   Smoking status: Never   Smokeless tobacco: Never  Vaping Use   Vaping Use: Never used  Substance and Sexual Activity   Alcohol use: Yes    Alcohol/week: 1.0 standard drink    Types: 1 Glasses of wine per week    Comment: occ   Drug use: No   Sexual activity: Not on file  Other Topics Concern   Not on file  Social History Narrative   Married to Dr. Dorcas Mcmurray.  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  Non smoker.   Social Determinants of Health   Financial Resource Strain: Not on file  Food Insecurity: Not on file  Transportation Needs: Not on file  Physical Activity: Not on file  Stress: Not on file  Social Connections: Not on file  Intimate Partner Violence: Not on file    Family History  Problem Relation Age of  Onset   Osteoporosis Mother    Stroke Mother    Hypertension Mother    Hyperlipidemia Mother    Other Mother        perforated colon- surgicall induced   Heart disease Father 63       died MI   Colon cancer Neg Hx    Pancreatic cancer Neg Hx    Rectal cancer Neg Hx    Stomach cancer Neg Hx     BP 120/70   Ht 5\' 3"  (1.6 m)   Wt 190 lb (86.2 kg)   LMP 05/23/2011   BMI 33.66 kg/m   No flowsheet data found.  No flowsheet data found.  Review of Systems: See HPI above.     Objective:  Physical Exam:  Gen: NAD, comfortable in exam room  Right foot/ankle: Mild pitting edema medial ankle and lower leg.  No palpable cords.  No warmth, erythema, ecchymoses. FROM with pain on resisted internal rotation.  No pain resisted to flexion or extension TTP medial malleolus, over medial ankle tendons and distal medial lower leg.  No other tenderness Negative ant drawer and negative talar tilt.   Negative syndesmotic compression. Thompsons test negative. NV intact distally.  Limited MSK u/s right foot/ankle: No ankle joint effusion.  No cortical irregularities, edema overlying cortex of distal tibia.  Post tib tendon with tenosynovitis but intact.  No abnormalities of flexor hallucis longus or flexor digitorum.  Venous structures compressible popliteal fossa and lower leg.  Medial gastroc intact.  Achilles intact.   Assessment & Plan:  1. Right ankle pain - 2/2 posterior tibialis tenosynovitis.  New custom orthotics made today for patient.  Icing, aleve, compression, elevation.  Will wait on starting rehab exercises.  Avoid flat shoes, barefoot walking.  Patient was fitted for a : standard, cushioned, semi-rigid orthotic. The orthotic was heated and afterward the patient stood on the orthotic blank positioned on the orthotic stand. The patient was positioned in subtalar neutral position and 10 degrees of ankle dorsiflexion in a weight bearing stance. After completion of molding, a stable  base was applied to the orthotic blank. The blank was ground to a stable position for weight bearing. Size: 11W thin Base: blue med density eva Posting: none Additional orthotic padding: none

## 2021-05-04 ENCOUNTER — Other Ambulatory Visit (HOSPITAL_COMMUNITY): Payer: Self-pay

## 2021-05-04 MED FILL — Levothyroxine Sodium Tab 150 MCG: ORAL | 90 days supply | Qty: 90 | Fill #1 | Status: AC

## 2021-05-18 DIAGNOSIS — Z0289 Encounter for other administrative examinations: Secondary | ICD-10-CM

## 2021-05-26 ENCOUNTER — Ambulatory Visit (INDEPENDENT_AMBULATORY_CARE_PROVIDER_SITE_OTHER): Payer: 59 | Admitting: Family Medicine

## 2021-05-26 ENCOUNTER — Encounter (INDEPENDENT_AMBULATORY_CARE_PROVIDER_SITE_OTHER): Payer: Self-pay | Admitting: Family Medicine

## 2021-05-26 ENCOUNTER — Other Ambulatory Visit: Payer: Self-pay

## 2021-05-26 ENCOUNTER — Other Ambulatory Visit (HOSPITAL_COMMUNITY): Payer: Self-pay

## 2021-05-26 VITALS — BP 110/74 | HR 61 | Temp 97.6°F | Ht 63.0 in | Wt 192.0 lb

## 2021-05-26 DIAGNOSIS — R0602 Shortness of breath: Secondary | ICD-10-CM

## 2021-05-26 DIAGNOSIS — R635 Abnormal weight gain: Secondary | ICD-10-CM | POA: Diagnosis not present

## 2021-05-26 DIAGNOSIS — R600 Localized edema: Secondary | ICD-10-CM | POA: Diagnosis not present

## 2021-05-26 DIAGNOSIS — R5383 Other fatigue: Secondary | ICD-10-CM | POA: Diagnosis not present

## 2021-05-26 DIAGNOSIS — E7800 Pure hypercholesterolemia, unspecified: Secondary | ICD-10-CM | POA: Diagnosis not present

## 2021-05-26 DIAGNOSIS — E559 Vitamin D deficiency, unspecified: Secondary | ICD-10-CM | POA: Diagnosis not present

## 2021-05-26 DIAGNOSIS — R0683 Snoring: Secondary | ICD-10-CM | POA: Diagnosis not present

## 2021-05-26 DIAGNOSIS — K219 Gastro-esophageal reflux disease without esophagitis: Secondary | ICD-10-CM

## 2021-05-26 DIAGNOSIS — Z9189 Other specified personal risk factors, not elsewhere classified: Secondary | ICD-10-CM

## 2021-05-26 DIAGNOSIS — J302 Other seasonal allergic rhinitis: Secondary | ICD-10-CM

## 2021-05-26 DIAGNOSIS — F3289 Other specified depressive episodes: Secondary | ICD-10-CM

## 2021-05-26 DIAGNOSIS — E890 Postprocedural hypothyroidism: Secondary | ICD-10-CM | POA: Diagnosis not present

## 2021-05-26 DIAGNOSIS — R7301 Impaired fasting glucose: Secondary | ICD-10-CM | POA: Diagnosis not present

## 2021-05-26 DIAGNOSIS — E669 Obesity, unspecified: Secondary | ICD-10-CM

## 2021-05-26 DIAGNOSIS — Z780 Asymptomatic menopausal state: Secondary | ICD-10-CM

## 2021-05-26 DIAGNOSIS — Z6834 Body mass index (BMI) 34.0-34.9, adult: Secondary | ICD-10-CM

## 2021-05-26 DIAGNOSIS — R948 Abnormal results of function studies of other organs and systems: Secondary | ICD-10-CM

## 2021-05-26 MED ORDER — PHENTERMINE HCL 15 MG PO CAPS
15.0000 mg | ORAL_CAPSULE | ORAL | 0 refills | Status: DC
  Filled 2021-05-26: qty 30, 30d supply, fill #0

## 2021-05-26 MED ORDER — TROKENDI XR 25 MG PO CP24
1.0000 | ORAL_CAPSULE | Freq: Every day | ORAL | 1 refills | Status: DC
  Filled 2021-05-26: qty 30, 30d supply, fill #0

## 2021-05-26 NOTE — Progress Notes (Signed)
Chief Complaint:   OBESITY CHRISHAWN BELLE (MR# [id number]) is a 60 y.o. female who presents for evaluation and treatment of obesity and related comorbidities. Current BMI is Body mass index is 34.01 kg/m. Darnita has been struggling with her weight for many years and has been unsuccessful in either losing weight, maintaining weight loss, or reaching her healthy weight goal.  Janessah is currently in the action stage of change and ready to dedicate time achieving and maintaining a healthier weight. Katerra is interested in becoming our patient and working on intensive lifestyle modifications including (but not limited to) diet and exercise for weight loss.  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  Her wife is Dr. Dorcas Mcmurray.  She has been craving sweets over the past few years.  Shagun's habits were reviewed today and are as follows: Her family eats meals together, she thinks her family will eat healthier with her, her desired weight loss is 47 pounds, she has been heavy most of her life, she started gaining excessive weight in 2017 after foot surgery, her heaviest weight ever was 200 pounds, she craves sugar at night, salt during the day, she snacks frequently in the evenings, she is trying to follow a vegetarian diet, she is frequently drinking liquids with calories, she frequently makes poor food choices, and she struggles with emotional eating.  Depression Screen Natassja's Food and Mood (modified PHQ-9) score was 6.  Depression screen PHQ 2/9 05/26/2021  Decreased Interest 1  Down, Depressed, Hopeless 0  PHQ - 2 Score 1  Altered sleeping 1  Tired, decreased energy 2  Change in appetite 1  Feeling bad or failure about yourself  1  Trouble concentrating 0  Moving slowly or fidgety/restless 0  Suicidal thoughts 0  PHQ-9 Score 6  Difficult doing work/chores Not difficult at all   Assessment/Plan:   1. Other fatigue Laurencia admits to daytime somnolence and  denies waking up still tired. Patent has a history of symptoms of daytime fatigue and snoring. Issamar generally gets 7 or 8 hours of sleep per night, and states that she has generally restful sleep. Snoring is present. Apneic episodes are not present. Epworth Sleepiness Score is 4.  Mistey does feel that her weight is causing her energy to be lower than it should be. Fatigue may be related to obesity, depression or many other causes. Labs will be ordered, and in the meanwhile, Kadidiatou will focus on self care including making healthy food choices, increasing physical activity and focusing on stress reduction.  - EKG 12-Lead - Anemia panel - CBC with Differential/Platelet - Comprehensive metabolic panel  2. SOB (shortness of breath) on exertion Kloei notes increasing shortness of breath with exercising and seems to be worsening over time with weight gain. She notes getting out of breath sooner with activity than she used to. This has not gotten worse recently. Eleena denies shortness of breath at rest or orthopnea.  3. Abnormal weight gain After discussion, patient would like to start below medication. Expectations, risks, and potential side effects reviewed.   Start Trokendi XR 25 mg daily and phentermine 15 mg daily, as per below.  I have consulted the [place] Controlled Substances Registry for this patient, and feel the risk/benefit ratio today is favorable for proceeding with this prescription for a controlled substance. The patient understands monitoring parameters and red flags.   - Start Topiramate ER (TROKENDI XR) 25 MG CP24; Take 1 capsule (25 mg total) by mouth  daily.  Dispense: 30 capsule; Refill: 1 - Start phentermine 15 MG capsule; Take 1 capsule (15 mg total) by mouth every morning.  Dispense: 30 capsule; Refill: 0  4. Postoperative hypothyroidism Medication: levothyroxine 137 mcg daily.  Surgical removal of ectopic thyroid (2022).  Plan: Patient was instructed not to take MVM or  iron within 4 hours of taking thyroid medications. This issue is managed by Dr. Sheral Apley. Will check labs today.  Lab Results  Component Value Date   TSH 0.414 05/01/2013   - TSH - T4, free  5. Edema of right lower extremity Controlled. Will continue to follow along as it relates to her weight loss journey.   6. Gastroesophageal reflux disease, unspecified whether esophagitis present Ahmiya is taking Protonix 40 mg daily for GERD.  7. Seasonal allergies She takes daily Claritin 10 mg for seasonal allergies.  8. Postmenopausal Will continue to follow along as it relates to her weight loss journey.  9. Snores Harley endorses snoring.  Epworth Sleepiness Score is 4.  She has no history of sleep study.  10. Pure hypercholesterolemia Course: Not at goal. Lipid-lowering medications: None. LDL 165 on 09/01/2020.  Plan: Dietary changes: Increase soluble fiber, decrease simple carbohydrates, decrease saturated fat. Exercise changes: Moderate to vigorous-intensity aerobic activity 150 minutes per week or as tolerated. We will continue to monitor along with PCP/specialists as it pertains to her weight loss journey.  Will check labs today.  Lab Results  Component Value Date   CHOL 214 (H) 10/03/2010   HDL 78 10/03/2010   LDLCALC 119 (H) 10/03/2010   LDLDIRECT 130 (H) 05/01/2013   TRIG 85 10/03/2010   CHOLHDL 2.7 Ratio 10/03/2010   Lab Results  Component Value Date   ALT 15 10/02/2010   AST 21 10/02/2010   ALKPHOS 71 10/02/2010   BILITOT 0.5 10/02/2010   - Comprehensive metabolic panel - Lipid panel  11. Vitamin D deficiency Will check vitamin D level today, as per below.  - VITAMIN D 25 Hydroxy (Vit-D Deficiency, Fractures)  12. Fasting hyperglycemia Check A1c and insulin level today.  - Hemoglobin A1c - Insulin, random  13. Abnormal metabolism, slower than expected Start Trokendi 25 mg daily and phentermine 15 mg daily, as per below.  - Start Topiramate ER  (TROKENDI XR) 25 MG CP24; Take 1 capsule (25 mg total) by mouth daily.  Dispense: 30 capsule; Refill: 1 - Start phentermine 15 MG capsule; Take 1 capsule (15 mg total) by mouth every morning.  Dispense: 30 capsule; Refill: 0  14. Other depression, with emotional eating Not at goal. Medication: None.  Chianna emotionally eats when stressed and as a reward.  PHQ-9 is 6.  Plan:  Discussed cues and consequences, how thoughts affect eating, model of thoughts, feelings, and behaviors, and strategies for change by focusing on the cue. Discussed cognitive distortions, coping thoughts, and how to change your thoughts.  15. At risk for heart disease Due to Harrietta's current state of health and medical condition(s), she is at a higher risk for heart disease.  This puts the patient at much greater risk to subsequently develop cardiopulmonary conditions that can significantly affect patient's quality of life in a negative manner.    At least 8 minutes were spent on counseling Vani about these concerns today. Evidence-based interventions for health behavior change were utilized today including the discussion of self monitoring techniques, problem-solving barriers, and SMART goal setting techniques.  Specifically, regarding patient's less desirable eating habits and patterns, we employed the technique  of small changes when Shallan has not been able to fully commit to her prudent nutritional plan.  16. Class 1 obesity with serious comorbidity and body mass index (BMI) of 34.0 to 34.9 in adult, unspecified obesity type  Joice is currently in the action stage of change and her goal is to continue with weight loss efforts. I recommend Shanaia begin the structured treatment plan as follows:  She has agreed to keeping a food journal and adhering to recommended goals of 1000 calories and 85 grams of protein and the [place].  Exercise goals:  As is.    Behavioral modification strategies: increasing lean  protein intake, decreasing simple carbohydrates, increasing vegetables, increasing water intake, and decreasing liquid calories.  She was informed of the importance of frequent follow-up visits to maximize her success with intensive lifestyle modifications for her multiple health conditions. She was informed we would discuss her lab results at her next visit unless there is a critical issue that needs to be addressed sooner. Renoda agreed to keep her next visit at the agreed upon time to discuss these results.  Objective:   Blood pressure 110/74, pulse 61, temperature 97.6 F (36.4 C), temperature source Oral, height '5\' 3"'$  (1.6 m), weight 192 lb (87.1 kg), last menstrual period 05/23/2011, SpO2 96 %. Body mass index is 34.01 kg/m.  EKG: Normal sinus rhythm, rate 73 bpm.  Indirect Calorimeter completed today shows a VO2 of 191 and a REE of 1310.  Her calculated basal metabolic rate is 0000000 thus her basal metabolic rate is worse than expected.  General: Cooperative, alert, well developed, in no acute distress. HEENT: Conjunctivae and lids unremarkable. Cardiovascular: Regular rhythm.  Lungs: Normal work of breathing. Neurologic: No focal deficits.   Lab Results  Component Value Date   CREATININE 0.85 05/13/2015   BUN 25 (H) 05/13/2015   NA 137 05/13/2015   K 4.2 05/13/2015   CL 103 05/13/2015   CO2 25 05/13/2015   Lab Results  Component Value Date   ALT 15 10/02/2010   AST 21 10/02/2010   ALKPHOS 71 10/02/2010   BILITOT 0.5 10/02/2010   Lab Results  Component Value Date   HGBA1C 5.3 05/01/2013   Lab Results  Component Value Date   TSH 0.414 05/01/2013   Lab Results  Component Value Date   CHOL 214 (H) 10/03/2010   HDL 78 10/03/2010   LDLCALC 119 (H) 10/03/2010   LDLDIRECT 130 (H) 05/01/2013   TRIG 85 10/03/2010   CHOLHDL 2.7 Ratio 10/03/2010   Lab Results  Component Value Date   VD25OH 51 12/03/2011   Lab Results  Component Value Date   WBC 6.7 11/11/2020    HGB 14.4 11/11/2020   HCT 44.2 11/11/2020   MCV 87.2 11/11/2020   PLT 298 11/11/2020   Attestation Statements:   This is the patient's first visit at [facility]. The patient's NEW PATIENT PACKET was reviewed at length. Included in the packet: current and past health history, medications, allergies, ROS, gynecologic history (women only), surgical history, family history, social history, weight history, weight loss surgery history (for those that have had weight loss surgery), nutritional evaluation, mood and food questionnaire, PHQ9, Epworth questionnaire, sleep habits questionnaire, patient life and health improvement goals questionnaire. These will all be scanned into the patient's chart under media.   During the visit, I independently reviewed the patient's EKG, bioimpedance scale results, and indirect calorimeter results. I used this information to tailor a meal plan for the  patient that will help her to lose weight and will improve her obesity-related conditions going forward. I performed a medically necessary appropriate examination and/or evaluation. I discussed the assessment and treatment plan with the patient. The patient was provided an opportunity to ask questions and all were answered. The patient agreed with the plan and demonstrated an understanding of the instructions. Labs were ordered at this visit and will be reviewed at the next visit unless more critical results need to be addressed immediately. Clinical information was updated and documented in the EMR.   I, Water quality scientist, CMA, am acting as transcriptionist for Briscoe Deutscher, DO  I have reviewed the above documentation for accuracy and completeness, and I agree with the above. Briscoe Deutscher, DO

## 2021-05-27 LAB — COMPREHENSIVE METABOLIC PANEL
ALT: 27 IU/L (ref 0–32)
AST: 23 IU/L (ref 0–40)
Albumin/Globulin Ratio: 2 (ref 1.2–2.2)
Albumin: 4.7 g/dL (ref 3.8–4.9)
Alkaline Phosphatase: 114 IU/L (ref 44–121)
BUN/Creatinine Ratio: 23 (ref 9–23)
BUN: 20 mg/dL (ref 6–24)
Bilirubin Total: 0.3 mg/dL (ref 0.0–1.2)
CO2: 23 mmol/L (ref 20–29)
Calcium: 9.4 mg/dL (ref 8.7–10.2)
Chloride: 105 mmol/L (ref 96–106)
Creatinine, Ser: 0.88 mg/dL (ref 0.57–1.00)
Globulin, Total: 2.3 g/dL (ref 1.5–4.5)
Glucose: 95 mg/dL (ref 65–99)
Potassium: 4.5 mmol/L (ref 3.5–5.2)
Sodium: 142 mmol/L (ref 134–144)
Total Protein: 7 g/dL (ref 6.0–8.5)
eGFR: 76 mL/min/{1.73_m2} (ref >59)

## 2021-05-27 LAB — ANEMIA PANEL
Ferritin: 27 ng/mL (ref 15–150)
Hematocrit: 44.3 % (ref 34.0–46.6)
Iron Saturation: 13 % — ABNORMAL LOW (ref 15–55)
Iron: 49 ug/dL (ref 27–159)
Retic Ct Pct: 1.3 % (ref 0.6–2.6)
Total Iron Binding Capacity: 366 ug/dL (ref 250–450)
UIBC: 317 ug/dL (ref 131–425)
Vitamin B-12: 749 pg/mL (ref 232–1245)

## 2021-05-27 LAB — CBC WITH DIFFERENTIAL/PLATELET
Basophils Absolute: 0.1 10*3/uL (ref 0.0–0.2)
Basos: 1 %
EOS (ABSOLUTE): 0.2 10*3/uL (ref 0.0–0.4)
Eos: 3 %
Hemoglobin: 15.3 g/dL (ref 11.1–15.9)
Immature Grans (Abs): 0 10*3/uL (ref 0.0–0.1)
Immature Granulocytes: 0 %
Lymphocytes Absolute: 3 10*3/uL (ref 0.7–3.1)
Lymphs: 42 %
MCH: 29.3 pg (ref 26.6–33.0)
MCHC: 34.5 g/dL (ref 31.5–35.7)
MCV: 85 fL (ref 79–97)
Monocytes Absolute: 0.4 10*3/uL (ref 0.1–0.9)
Monocytes: 6 %
Neutrophils Absolute: 3.4 10*3/uL (ref 1.4–7.0)
Neutrophils: 48 %
Platelets: 302 10*3/uL (ref 150–450)
RBC: 5.23 x10E6/uL (ref 3.77–5.28)
RDW: 13.5 % (ref 11.7–15.4)
WBC: 7.2 10*3/uL (ref 3.4–10.8)

## 2021-05-27 LAB — INSULIN, RANDOM: INSULIN: 7.4 u[IU]/mL (ref 2.6–24.9)

## 2021-05-27 LAB — VITAMIN D 25 HYDROXY (VIT D DEFICIENCY, FRACTURES): Vit D, 25-Hydroxy: 31.1 ng/mL (ref 30.0–100.0)

## 2021-05-27 LAB — LIPID PANEL
Chol/HDL Ratio: 3.6 ratio (ref 0.0–4.4)
Cholesterol, Total: 279 mg/dL — ABNORMAL HIGH (ref 100–199)
HDL: 78 mg/dL (ref >39)
LDL Chol Calc (NIH): 184 mg/dL — ABNORMAL HIGH (ref 0–99)
Triglycerides: 102 mg/dL (ref 0–149)
VLDL Cholesterol Cal: 17 mg/dL (ref 5–40)

## 2021-05-27 LAB — HEMOGLOBIN A1C
Est. average glucose Bld gHb Est-mCnc: 108 mg/dL
Hgb A1c MFr Bld: 5.4 % (ref 4.8–5.6)

## 2021-05-27 LAB — T4, FREE: Free T4: 1.01 ng/dL (ref 0.82–1.77)

## 2021-05-27 LAB — SPECIMEN STATUS REPORT

## 2021-05-27 LAB — TSH: TSH: 2.11 u[IU]/mL (ref 0.450–4.500)

## 2021-06-07 ENCOUNTER — Encounter (INDEPENDENT_AMBULATORY_CARE_PROVIDER_SITE_OTHER): Payer: Self-pay

## 2021-06-08 ENCOUNTER — Ambulatory Visit (INDEPENDENT_AMBULATORY_CARE_PROVIDER_SITE_OTHER): Payer: 59 | Admitting: Family Medicine

## 2021-06-08 ENCOUNTER — Other Ambulatory Visit: Payer: Self-pay

## 2021-06-08 ENCOUNTER — Encounter (INDEPENDENT_AMBULATORY_CARE_PROVIDER_SITE_OTHER): Payer: Self-pay | Admitting: Family Medicine

## 2021-06-08 VITALS — BP 107/73 | HR 75 | Temp 97.9°F | Ht 63.0 in | Wt 185.0 lb

## 2021-06-08 DIAGNOSIS — E669 Obesity, unspecified: Secondary | ICD-10-CM

## 2021-06-08 DIAGNOSIS — R790 Abnormal level of blood mineral: Secondary | ICD-10-CM

## 2021-06-08 DIAGNOSIS — H52223 Regular astigmatism, bilateral: Secondary | ICD-10-CM | POA: Diagnosis not present

## 2021-06-08 DIAGNOSIS — E7800 Pure hypercholesterolemia, unspecified: Secondary | ICD-10-CM

## 2021-06-08 DIAGNOSIS — R632 Polyphagia: Secondary | ICD-10-CM

## 2021-06-08 DIAGNOSIS — H2513 Age-related nuclear cataract, bilateral: Secondary | ICD-10-CM | POA: Diagnosis not present

## 2021-06-08 DIAGNOSIS — E038 Other specified hypothyroidism: Secondary | ICD-10-CM

## 2021-06-08 DIAGNOSIS — Z6834 Body mass index (BMI) 34.0-34.9, adult: Secondary | ICD-10-CM

## 2021-06-08 DIAGNOSIS — H5203 Hypermetropia, bilateral: Secondary | ICD-10-CM | POA: Diagnosis not present

## 2021-06-08 DIAGNOSIS — H524 Presbyopia: Secondary | ICD-10-CM | POA: Diagnosis not present

## 2021-06-08 NOTE — Progress Notes (Signed)
Chief Complaint:   OBESITY Doris Aguilar is here to discuss her progress with her obesity treatment plan along with follow-up of her obesity related diagnoses.   Today's visit was #: 2 Starting weight: 192 lbs Starting date: 05/26/2021 Today's weight: 185 lbs Today's date: 06/08/2021 Weight change since last visit: 7 lbs Total lbs lost to date: 7 lbs Body mass index is 32.77 kg/m.  Total weight loss percentage to date: -3.65%  Current Meal Plan: keeping a food journal and adhering to recommended goals of 1000 calories and 85 grams of protein and the [place] for 95% of the time.  Current Exercise Plan: Cardio/strength training for 30+ minutes 7 times per week. Current Anti-Obesity Medications: phentermine 15 mg daily and topiramate 25 mg daily. Side effects: None.  Interim History:  Doris Aguilar is taking phentermine around 11 am and topiramate at night, and is now tolerating the combination  She had a very difficult first week adjusting to the medications as well as her new dietary habits. She endorses continued polyphagia. She would like more varied choices for the vegetarian plan. She endorses constipation - was previously taking Metamucil daily but did not know if she could continue.  She will be going on a 10 day meditation retreat in mid October and would prefer to make medication changes after.  Assessment/Plan:   1. Pure hypercholesterolemia Course: Not at goal. Lipid-lowering medications: None.   Plan: Dietary changes: Increase soluble fiber, decrease simple carbohydrates, decrease saturated fat. Exercise changes: Moderate to vigorous-intensity aerobic activity 150 minutes per week or as tolerated. Will order cardiac CT score today for further risk stratification.  Lab Results  Component Value Date   CHOL 279 (H) 05/26/2021   HDL 78 05/26/2021   LDLCALC 184 (H) 05/26/2021   LDLDIRECT 130 (H) 05/01/2013   TRIG 102 05/26/2021   CHOLHDL 3.6 05/26/2021   Lab Results   Component Value Date   ALT 27 05/26/2021   AST 23 05/26/2021   ALKPHOS 114 05/26/2021   BILITOT 0.3 05/26/2021   The 10-year ASCVD risk score (Arnett DK, et al., 2019) is: 2.2%   Values used to calculate the score:     Age: 60 years     Sex: Female     Is Non-Hispanic African American: No     Diabetic: No     Tobacco smoker: No     Systolic Blood Pressure: 993 mmHg     Is BP treated: No     HDL Cholesterol: 78 mg/dL     Total Cholesterol: 279 mg/dL  - CT CARDIAC SCORING ([facility] LOCATIONS ONLY); Future  2. Low ferritin Mild. Recommend Doris Aguilar increase the iron in her diet. Nutrition: Iron-rich foods include dark leafy greens, eggs, seafood, and beans.   3. Other specified hypothyroidism Course: Controlled. Medication: levothyroxine 150 mcg daily.   Plan: Patient was instructed not to take MVM or iron within 4 hours of taking thyroid medications. This issue is managed by Dr. Sheral Apley. We will continue to monitor alongside as it relates to her weight loss journey.   Lab Results  Component Value Date   TSH 2.110 05/26/2021   4. Polyphagia Not at goal. Current treatment: phentermine 15 mg daily and topiramate 25 mg daily . She will continue to focus on protein-rich, low simple carbohydrate foods. We reviewed the importance of hydration, regular exercise for stress reduction, and restorative sleep.  Plan:  Will refill phentermine and topiramate today at current doses. We discussed transitioning to a [place]  when she is back from her retreat.   I have consulted the [place] Controlled Substances Registry for this patient, and feel the risk/benefit ratio today is favorable for proceeding with this prescription for a controlled substance. The patient understands monitoring parameters and red flags.   5. Obesity, current BMI 32.9  Course: Doris Aguilar is currently in the action stage of change. As such, her goal is to continue with weight loss efforts.   Nutrition goals: She has agreed to keeping  a food journal and adhering to recommended goals of 1000 calories and 85 grams of protein.   Exercise goals:  As is.  Behavioral modification strategies: increasing lean protein intake, decreasing simple carbohydrates, increasing vegetables, increasing water intake, and increasing high fiber foods.  Doris Aguilar has agreed to follow-up with our clinic in 4 weeks. She was informed of the importance of frequent follow-up visits to maximize her success with intensive lifestyle modifications for her multiple health conditions.   Objective:   Blood pressure 107/73, pulse 75, temperature 97.9 F (36.6 C), temperature source Oral, height 5\' 3"  (1.6 m), weight 185 lb (83.9 kg), last menstrual period 05/23/2011, SpO2 96 %. Body mass index is 32.77 kg/m.  General: Cooperative, alert, well developed, in no acute distress. HEENT: Conjunctivae and lids unremarkable. Cardiovascular: Regular rhythm.  Lungs: Normal work of breathing. Neurologic: No focal deficits.   Lab Results  Component Value Date   CREATININE 0.88 05/26/2021   BUN 20 05/26/2021   NA 142 05/26/2021   K 4.5 05/26/2021   CL 105 05/26/2021   CO2 23 05/26/2021   Lab Results  Component Value Date   ALT 27 05/26/2021   AST 23 05/26/2021   ALKPHOS 114 05/26/2021   BILITOT 0.3 05/26/2021   Lab Results  Component Value Date   HGBA1C 5.4 05/26/2021   HGBA1C 5.3 05/01/2013   Lab Results  Component Value Date   INSULIN 7.4 05/26/2021   Lab Results  Component Value Date   TSH 2.110 05/26/2021   Lab Results  Component Value Date   CHOL 279 (H) 05/26/2021   HDL 78 05/26/2021   LDLCALC 184 (H) 05/26/2021   LDLDIRECT 130 (H) 05/01/2013   TRIG 102 05/26/2021   CHOLHDL 3.6 05/26/2021   Lab Results  Component Value Date   VD25OH 31.1 05/26/2021   VD25OH 51 12/03/2011   Lab Results  Component Value Date   WBC 7.2 05/26/2021   HGB 15.3 05/26/2021   HCT 44.3 05/26/2021   MCV 85 05/26/2021   PLT 302 05/26/2021   Lab  Results  Component Value Date   IRON 49 05/26/2021   TIBC 366 05/26/2021   FERRITIN 27 05/26/2021   Attestation Statements:   Reviewed by clinician on day of visit: allergies, medications, problem list, medical history, surgical history, family history, social history, and previous encounter notes.  I, Water quality scientist, CMA, am acting as transcriptionist for Briscoe Deutscher, DO  I have reviewed the above documentation for accuracy and completeness, and I agree with the above. Briscoe Deutscher, DO

## 2021-06-09 ENCOUNTER — Other Ambulatory Visit (HOSPITAL_COMMUNITY): Payer: Self-pay

## 2021-06-09 ENCOUNTER — Ambulatory Visit (INDEPENDENT_AMBULATORY_CARE_PROVIDER_SITE_OTHER): Payer: Self-pay | Admitting: Family Medicine

## 2021-06-09 MED ORDER — PHENTERMINE HCL 15 MG PO CAPS
15.0000 mg | ORAL_CAPSULE | ORAL | 0 refills | Status: DC
  Filled 2021-06-09 – 2021-06-19 (×3): qty 30, 30d supply, fill #0

## 2021-06-09 MED ORDER — TROKENDI XR 25 MG PO CP24
1.0000 | ORAL_CAPSULE | Freq: Every day | ORAL | 1 refills | Status: DC
  Filled 2021-06-09 – 2021-06-13 (×2): qty 30, 30d supply, fill #0

## 2021-06-13 ENCOUNTER — Encounter (INDEPENDENT_AMBULATORY_CARE_PROVIDER_SITE_OTHER): Payer: Self-pay | Admitting: Family Medicine

## 2021-06-13 ENCOUNTER — Other Ambulatory Visit (HOSPITAL_COMMUNITY): Payer: Self-pay

## 2021-06-13 NOTE — Telephone Encounter (Signed)
Pt last seen by Dr. Wallace.  

## 2021-06-14 ENCOUNTER — Other Ambulatory Visit (HOSPITAL_COMMUNITY): Payer: Self-pay

## 2021-06-16 ENCOUNTER — Other Ambulatory Visit (HOSPITAL_COMMUNITY): Payer: Self-pay

## 2021-06-19 ENCOUNTER — Other Ambulatory Visit (HOSPITAL_COMMUNITY): Payer: Self-pay

## 2021-07-04 ENCOUNTER — Other Ambulatory Visit (HOSPITAL_COMMUNITY): Payer: Self-pay

## 2021-07-04 ENCOUNTER — Ambulatory Visit (INDEPENDENT_AMBULATORY_CARE_PROVIDER_SITE_OTHER): Payer: 59 | Admitting: Family Medicine

## 2021-07-04 ENCOUNTER — Other Ambulatory Visit: Payer: Self-pay

## 2021-07-04 ENCOUNTER — Ambulatory Visit
Admission: RE | Admit: 2021-07-04 | Discharge: 2021-07-04 | Disposition: A | Discharge status: Home / Self Care | Payer: No Typology Code available for payment source | Source: Ambulatory Visit | Attending: Family Medicine | Admitting: Family Medicine

## 2021-07-04 ENCOUNTER — Encounter (INDEPENDENT_AMBULATORY_CARE_PROVIDER_SITE_OTHER): Payer: Self-pay | Admitting: Family Medicine

## 2021-07-04 VITALS — BP 101/67 | HR 68 | Temp 97.7°F | Ht 63.0 in | Wt 179.0 lb

## 2021-07-04 DIAGNOSIS — R632 Polyphagia: Secondary | ICD-10-CM | POA: Diagnosis not present

## 2021-07-04 DIAGNOSIS — R931 Abnormal findings on diagnostic imaging of heart and coronary circulation: Secondary | ICD-10-CM | POA: Diagnosis not present

## 2021-07-04 DIAGNOSIS — Z6834 Body mass index (BMI) 34.0-34.9, adult: Secondary | ICD-10-CM

## 2021-07-04 DIAGNOSIS — K5903 Drug induced constipation: Secondary | ICD-10-CM

## 2021-07-04 DIAGNOSIS — K219 Gastro-esophageal reflux disease without esophagitis: Secondary | ICD-10-CM | POA: Insufficient documentation

## 2021-07-04 DIAGNOSIS — E039 Hypothyroidism, unspecified: Secondary | ICD-10-CM

## 2021-07-04 DIAGNOSIS — E7800 Pure hypercholesterolemia, unspecified: Secondary | ICD-10-CM

## 2021-07-04 DIAGNOSIS — E669 Obesity, unspecified: Secondary | ICD-10-CM

## 2021-07-04 MED ORDER — ONDANSETRON 4 MG PO TBDP
4.0000 mg | ORAL_TABLET | Freq: Three times a day (TID) | ORAL | 0 refills | Status: DC | PRN
  Filled 2021-07-04: qty 20, 7d supply, fill #0

## 2021-07-04 MED ORDER — TIRZEPATIDE 2.5 MG/0.5ML ~~LOC~~ SOAJ
2.5000 mg | SUBCUTANEOUS | 0 refills | Status: DC
Start: 2021-07-04 — End: 2021-07-31
  Filled 2021-07-04: qty 2, 28d supply, fill #0

## 2021-07-04 NOTE — Progress Notes (Signed)
Chief Complaint:   OBESITY Doris Aguilar is here to discuss her progress with her obesity treatment plan along with follow-up of her obesity related diagnoses. See Medical Weight Management Flowsheet for complete bioelectrical impedance results.  Today's visit was #: 3 Starting weight: 192 lbs Starting date: 05/26/2021 Weight change since last visit: 6 lbs Total lbs lost to date: 13 lbs Total weight loss percentage to date: -6.77%  Nutrition Plan: Keeping a food journal and adhering to recommended goals of 1000 calories and 85 grams of protein daily for 50% of the time. Activity: Increased walking. Anti-obesity medications: phentermine 15 mg daily, topiramate 25 mg daily. Reported side effects: Constipation.  Interim History: Doris Aguilar endorses constipation with phentermine/topiramate.  Still with polyphagia.  She just got back from a retreat.  Assessment/Plan:   Diagnoses and all orders for this visit:  Polyphagia Not at goal. Current treatment: phentermine 15 mg daily, topiramate 25 mg daily.    Plan:  Start Mounjaro 2.5 mg subcutaneously weekly and Zofran 4 mg every 8 hours as needed for nausea.  She will continue to focus on protein-rich, low simple carbohydrate foods. We reviewed the importance of hydration, regular exercise for stress reduction, and restorative sleep.   - Start tirzepatide [facility]) 2.5 MG/0.5ML Pen; Inject 2.5 mg into the skin once a week.  - Start ondansetron (ZOFRAN ODT) 4 MG disintegrating tablet; Take 1 tablet (4 mg total) by mouth every 8 (eight) hours as needed for nausea or vomiting.  Hypothyroidism Course: Controlled. Medication: levothyroxine 150 mcg daily.   Plan: Patient was instructed not to take MVM or iron within 4 hours of taking thyroid medications. This issue is managed by Dr. Sheral Apley. We will continue to monitor alongside PCP as it relates to her weight loss journey.   Lab Results  Component Value Date   TSH 2.110 05/26/2021    Drug-induced constipation This problem is poorly controlled.   Plan:  We reviewed the bowel regimen today.  Doris Aguilar was informed that a decrease in bowel movement frequency is normal while losing weight, but stools should not be hard or painful.  Counseling: Getting to Good Bowel Health: Your goal is to have one soft bowel movement each day. Drink at least 8 glasses of water each day. Eat plenty of fiber (goal is over 30 grams each day). It is best to get most of your fiber from dietary sources which includes leafy green vegetables, fresh fruit, and whole grains. You may need to add fiber with the help of OTC fiber supplements. These include Metamucil, Citrucel, and Benefiber. If you are still having trouble, try adding an osmotic laxative such as Miralax. If all of these changes do not work, Cabin crew.   Elevated coronary artery calcium score  CT CARDIAC SCORING (07/04/2021) IMPRESSION: 1. Coronary calcium score of 97.7 is at the 91st percentile for the patient's age, sex and race. 2. Probable small hiatal hernia.  CAC score ?100 or >75 percentile for age, sex, and race: For patients in this moderate or more category of CAC, we recommend treating with a statin if the low density lipoprotein cholesterol (LDL-C) is between 100 and 190 mg/dL. This is consistent with professional society guidelines that recommend statin therapy in those with CAC >100 or >75th percentile for age, sex, and race and consider statins in those with any measurable CAC.   Aspirin appears to have a net clinical benefit (which outweighs the risk) for ASCVD primary prevention in patients under 29 years old  who also have CAC ?100, regardless of their ASCVD risk category. Shared decision-making discussions between patient and their provider(s) are warranted prior to initiating therapy.  The 10-year ASCVD risk score (Arnett DK, et al., 2019) is: 1.9%   Values used to calculate the score:     Age: 2 years      Sex: Female     Is Non-Hispanic African American: No     Diabetic: No     Tobacco smoker: No     Systolic Blood Pressure: 782 mmHg     Is BP treated: No     HDL Cholesterol: 78 mg/dL     Total Cholesterol: 279 mg/dL  Lab Results  Component Value Date   CHOL 279 (H) 05/26/2021   HDL 78 05/26/2021   LDLCALC 184 (H) 05/26/2021   LDLDIRECT 130 (H) 05/01/2013   TRIG 102 05/26/2021   CHOLHDL 3.6 05/26/2021   Obesity, current BMI 31.8  Course: Doris Aguilar is currently in the action stage of change. As such, her goal is to continue with weight loss efforts.   Nutrition goals: She has agreed to keeping a food journal and adhering to recommended goals of 1000 calories and 85 grams of protein.   Exercise goals:  As is.  Behavioral modification strategies: increasing lean protein intake, decreasing simple carbohydrates, increasing vegetables, increasing water intake, and increasing high fiber foods.  Doris Aguilar has agreed to follow-up with our clinic in 3 weeks. She was informed of the importance of frequent follow-up visits to maximize her success with intensive lifestyle modifications for her multiple health conditions.   Objective:   Blood pressure 101/67, pulse 68, temperature 97.7 F (36.5 C), temperature source Oral, height 5\' 3"  (1.6 m), weight 179 lb (81.2 kg), last menstrual period 05/23/2011, SpO2 96 %. Body mass index is 31.71 kg/m.  General: Cooperative, alert, well developed, in no acute distress. HEENT: Conjunctivae and lids unremarkable. Cardiovascular: Regular rhythm.  Lungs: Normal work of breathing. Neurologic: No focal deficits.   Lab Results  Component Value Date   CREATININE 0.88 05/26/2021   BUN 20 05/26/2021   NA 142 05/26/2021   K 4.5 05/26/2021   CL 105 05/26/2021   CO2 23 05/26/2021   Lab Results  Component Value Date   ALT 27 05/26/2021   AST 23 05/26/2021   ALKPHOS 114 05/26/2021   BILITOT 0.3 05/26/2021   Lab Results  Component Value Date    HGBA1C 5.4 05/26/2021   HGBA1C 5.3 05/01/2013   Lab Results  Component Value Date   INSULIN 7.4 05/26/2021   Lab Results  Component Value Date   TSH 2.110 05/26/2021   Lab Results  Component Value Date   CHOL 279 (H) 05/26/2021   HDL 78 05/26/2021   LDLCALC 184 (H) 05/26/2021   LDLDIRECT 130 (H) 05/01/2013   TRIG 102 05/26/2021   CHOLHDL 3.6 05/26/2021   Lab Results  Component Value Date   VD25OH 31.1 05/26/2021   VD25OH 51 12/03/2011   Lab Results  Component Value Date   WBC 7.2 05/26/2021   HGB 15.3 05/26/2021   HCT 44.3 05/26/2021   MCV 85 05/26/2021   PLT 302 05/26/2021   Lab Results  Component Value Date   IRON 49 05/26/2021   TIBC 366 05/26/2021   FERRITIN 27 05/26/2021   Attestation Statements:   Reviewed by clinician on day of visit: allergies, medications, problem list, medical history, surgical history, family history, social history, and previous encounter notes.  I, Water quality scientist, CMA,  am acting as transcriptionist for Briscoe Deutscher, DO  I have reviewed the above documentation for accuracy and completeness, and I agree with the above. -  Briscoe Deutscher, DO, MS, FAAFP, DABOM - Family and Bariatric Medicine.

## 2021-07-08 ENCOUNTER — Encounter (INDEPENDENT_AMBULATORY_CARE_PROVIDER_SITE_OTHER): Payer: Self-pay | Admitting: Family Medicine

## 2021-07-10 ENCOUNTER — Encounter (INDEPENDENT_AMBULATORY_CARE_PROVIDER_SITE_OTHER): Payer: Self-pay | Admitting: Family Medicine

## 2021-07-10 NOTE — Telephone Encounter (Signed)
Dr.Wallace °

## 2021-07-11 MED ORDER — TRAMADOL HCL 50 MG PO TABS
50.0000 mg | ORAL_TABLET | Freq: Three times a day (TID) | ORAL | 0 refills | Status: AC | PRN
  Filled 2021-07-11: qty 15, 5d supply, fill #0

## 2021-07-12 ENCOUNTER — Other Ambulatory Visit (HOSPITAL_COMMUNITY): Payer: Self-pay

## 2021-07-27 ENCOUNTER — Telehealth: Payer: Self-pay | Admitting: *Deleted

## 2021-07-31 ENCOUNTER — Other Ambulatory Visit: Payer: Self-pay

## 2021-07-31 ENCOUNTER — Other Ambulatory Visit (HOSPITAL_COMMUNITY): Payer: Self-pay

## 2021-07-31 ENCOUNTER — Encounter (INDEPENDENT_AMBULATORY_CARE_PROVIDER_SITE_OTHER): Payer: Self-pay | Admitting: Family Medicine

## 2021-07-31 ENCOUNTER — Ambulatory Visit (INDEPENDENT_AMBULATORY_CARE_PROVIDER_SITE_OTHER): Payer: 59 | Admitting: Family Medicine

## 2021-07-31 VITALS — BP 111/70 | HR 73 | Temp 97.7°F | Ht 63.0 in | Wt 171.0 lb

## 2021-07-31 DIAGNOSIS — Z6834 Body mass index (BMI) 34.0-34.9, adult: Secondary | ICD-10-CM | POA: Diagnosis not present

## 2021-07-31 DIAGNOSIS — E669 Obesity, unspecified: Secondary | ICD-10-CM | POA: Diagnosis not present

## 2021-07-31 DIAGNOSIS — R931 Abnormal findings on diagnostic imaging of heart and coronary circulation: Secondary | ICD-10-CM | POA: Diagnosis not present

## 2021-07-31 DIAGNOSIS — K5903 Drug induced constipation: Secondary | ICD-10-CM

## 2021-07-31 DIAGNOSIS — R1011 Right upper quadrant pain: Secondary | ICD-10-CM | POA: Diagnosis not present

## 2021-07-31 DIAGNOSIS — R632 Polyphagia: Secondary | ICD-10-CM | POA: Diagnosis not present

## 2021-07-31 DIAGNOSIS — E7800 Pure hypercholesterolemia, unspecified: Secondary | ICD-10-CM

## 2021-07-31 DIAGNOSIS — R948 Abnormal results of function studies of other organs and systems: Secondary | ICD-10-CM | POA: Diagnosis not present

## 2021-07-31 MED ORDER — TROKENDI XR 25 MG PO CP24
1.0000 | ORAL_CAPSULE | Freq: Every day | ORAL | 1 refills | Status: DC
  Filled 2021-07-31: qty 30, 30d supply, fill #0

## 2021-07-31 MED ORDER — PHENTERMINE HCL 15 MG PO CAPS
15.0000 mg | ORAL_CAPSULE | Freq: Two times a day (BID) | ORAL | 0 refills | Status: DC
  Filled 2021-07-31 – 2021-08-02 (×2): qty 60, 30d supply, fill #0

## 2021-07-31 NOTE — Progress Notes (Signed)
Chief Complaint:   OBESITY Doris Aguilar is here to discuss her progress with her obesity treatment plan along with follow-up of her obesity related diagnoses. See Medical Weight Management Flowsheet for complete bioelectrical impedance results.  Today's visit was #: 4 Starting weight: 192 lbs Starting date: 05/26/2021 Weight change since last visit: 8 lbs Total lbs lost to date: 21 lbs Total weight loss percentage to date: -10.94%  Nutrition Plan: Keeping a food journal and adhering to recommended goals of 1000 calories and 85 grams of protein daily for 95% of the time. Activity: Cardio/strength for 30 minutes 7 times per week.  Anti-obesity medications: phentermine 15 mg daily and topiramate 25 mg daily. Reported side effects: None.  Interim History: Doris Aguilar has restarted phentermine/topiramate.  She has been having cravings for salty, crunchy foods (chips/crackers), and sweets at night. She reports that her abdominal symptoms have improved.  She is still taking the PPI.  Assessment/Plan:   1. Polyphagia Not at goal. Current treatment: phentermine 15 mg daily and topiramate 25 mg daily.    Plan:  Increase phentermine to 15 mg twice daily (early am and noon) and continue topiramate 25 mg daily.  She will continue to focus on protein-rich, low simple carbohydrate foods. We reviewed the importance of hydration, regular exercise for stress reduction, and restorative sleep.  - Increase and refill phentermine 15 MG capsule; Take 1 capsule (15 mg total) by mouth in the morning and at bedtime.  Dispense: 60 capsule; Refill: 0 - Refill Topiramate ER (TROKENDI XR) 25 MG CP24; Take 1 capsule (25 mg total) by mouth daily.  Dispense: 30 capsule; Refill: 1  2. Pure hypercholesterolemia Course: Not at goal. Lipid-lowering medications: None.   Plan: Dietary changes: Increase soluble fiber, decrease simple carbohydrates, decrease saturated fat. Exercise changes: Moderate to vigorous-intensity  aerobic activity 150 minutes per week or as tolerated. We will continue to monitor along with PCP as it pertains to her weight loss journey.  Lab Results  Component Value Date   CHOL 279 (H) 05/26/2021   HDL 78 05/26/2021   LDLCALC 184 (H) 05/26/2021   LDLDIRECT 130 (H) 05/01/2013   TRIG 102 05/26/2021   CHOLHDL 3.6 05/26/2021   Lab Results  Component Value Date   ALT 27 05/26/2021   AST 23 05/26/2021   ALKPHOS 114 05/26/2021   BILITOT 0.3 05/26/2021   The 10-year ASCVD risk score (Arnett DK, et al., 2019) is: 2.3%   Values used to calculate the score:     Age: 60 years     Sex: Female     Is Non-Hispanic African American: No     Diabetic: No     Tobacco smoker: No     Systolic Blood Pressure: 476 mmHg     Is BP treated: No     HDL Cholesterol: 78 mg/dL     Total Cholesterol: 279 mg/dL  Coronary calcium score of 97.7 is at the 91st percentile for the patient's age, sex and race.  3. Drug-induced constipation This problem is poorly controlled.    Plan:  We reviewed bowel regimen today.   Doris Aguilar was informed that a decrease in bowel movement frequency is normal while losing weight, but stools should not be hard or painful.  Counseling: Getting to Good Bowel Health: Your goal is to have one soft bowel movement each day. Drink at least 8 glasses of water each day. Eat plenty of fiber (goal is over 30 grams each day). It is best to get  most of your fiber from dietary sources which includes leafy green vegetables, fresh fruit, and whole grains. You may need to add fiber with the help of OTC fiber supplements. These include Metamucil, Citrucel, and Benefiber. If you are still having trouble, try adding an osmotic laxative such as Miralax. If all of these changes do not work, let me know and I may prescribe Linzess.  4. RUQ abdominal pain Improved.  Doris Aguilar continues to take Protonix 40 mg daily.  Plan:  Continue Protonix.  Will check labs today.  - CBC with  Differential/Platelet - Comprehensive metabolic panel - Lipase  5. Abnormal metabolism, slow Doris Aguilar's metabolism is slower than normal.  She is taking phentermine and topiramate.  6. Elevated coronary artery calcium score Coronary calcium score of 97.7 is at the 91st percentile for the patient's age, sex and race. Shared decision making discussion - will hold off on starting a statin.   7. Obesity, current BMI 30.4  Course: Doris Aguilar is currently in the action stage of change. As such, her goal is to continue with weight loss efforts.   Nutrition goals: She has agreed to keeping a food journal and adhering to recommended goals of 1000 calories and 85 grams of protein.   Exercise goals:  As is.  Behavioral modification strategies: increasing lean protein intake, decreasing simple carbohydrates, increasing vegetables, and increasing water intake.  Doris Aguilar has agreed to follow-up with our clinic in 4 weeks. She was informed of the importance of frequent follow-up visits to maximize her success with intensive lifestyle modifications for her multiple health conditions.   Doris Aguilar was informed we would discuss her lab results at her next visit unless there is a critical issue that needs to be addressed sooner. Doris Aguilar agreed to keep her next visit at the agreed upon time to discuss these results.  Objective:   Blood pressure 111/70, pulse 73, temperature 97.7 F (36.5 C), temperature source Oral, height 5\' 3"  (1.6 m), weight 171 lb (77.6 kg), last menstrual period 05/23/2011, SpO2 98 %. Body mass index is 30.29 kg/m.  General: Cooperative, alert, well developed, in no acute distress. HEENT: Conjunctivae and lids unremarkable. Cardiovascular: Regular rhythm.  Lungs: Normal work of breathing. Neurologic: No focal deficits.   Lab Results  Component Value Date   CREATININE 0.88 05/26/2021   BUN 20 05/26/2021   NA 142 05/26/2021   K 4.5 05/26/2021   CL 105 05/26/2021   CO2 23  05/26/2021   Lab Results  Component Value Date   ALT 27 05/26/2021   AST 23 05/26/2021   ALKPHOS 114 05/26/2021   BILITOT 0.3 05/26/2021   Lab Results  Component Value Date   HGBA1C 5.4 05/26/2021   HGBA1C 5.3 05/01/2013   Lab Results  Component Value Date   INSULIN 7.4 05/26/2021   Lab Results  Component Value Date   TSH 2.110 05/26/2021   Lab Results  Component Value Date   CHOL 279 (H) 05/26/2021   HDL 78 05/26/2021   LDLCALC 184 (H) 05/26/2021   LDLDIRECT 130 (H) 05/01/2013   TRIG 102 05/26/2021   CHOLHDL 3.6 05/26/2021   Lab Results  Component Value Date   VD25OH 31.1 05/26/2021   VD25OH 51 12/03/2011   Lab Results  Component Value Date   WBC 7.2 05/26/2021   HGB 15.3 05/26/2021   HCT 44.3 05/26/2021   MCV 85 05/26/2021   PLT 302 05/26/2021   Lab Results  Component Value Date   IRON 49 05/26/2021   TIBC  366 05/26/2021   FERRITIN 27 05/26/2021   Attestation Statements:   Reviewed by clinician on day of visit: allergies, medications, problem list, medical history, surgical history, family history, social history, and previous encounter notes.  I, Water quality scientist, CMA, am acting as transcriptionist for Briscoe Deutscher, DO  I have reviewed the above documentation for accuracy and completeness, and I agree with the above. -  Briscoe Deutscher, DO, MS, FAAFP, DABOM - Family and Bariatric Medicine.

## 2021-08-01 ENCOUNTER — Telehealth (INDEPENDENT_AMBULATORY_CARE_PROVIDER_SITE_OTHER): Payer: Self-pay | Admitting: Family Medicine

## 2021-08-01 ENCOUNTER — Encounter (INDEPENDENT_AMBULATORY_CARE_PROVIDER_SITE_OTHER): Payer: Self-pay

## 2021-08-01 LAB — COMPREHENSIVE METABOLIC PANEL
ALT: 14 IU/L (ref 0–32)
AST: 16 IU/L (ref 0–40)
Albumin/Globulin Ratio: 1.8 (ref 1.2–2.2)
Albumin: 4.5 g/dL (ref 3.8–4.9)
Alkaline Phosphatase: 96 IU/L (ref 44–121)
BUN/Creatinine Ratio: 26 — ABNORMAL HIGH (ref 9–23)
BUN: 23 mg/dL (ref 6–24)
Bilirubin Total: 0.5 mg/dL (ref 0.0–1.2)
CO2: 20 mmol/L (ref 20–29)
Calcium: 9.4 mg/dL (ref 8.7–10.2)
Chloride: 106 mmol/L (ref 96–106)
Creatinine, Ser: 0.9 mg/dL (ref 0.57–1.00)
Globulin, Total: 2.5 g/dL (ref 1.5–4.5)
Glucose: 89 mg/dL (ref 70–99)
Potassium: 4.3 mmol/L (ref 3.5–5.2)
Sodium: 141 mmol/L (ref 134–144)
Total Protein: 7 g/dL (ref 6.0–8.5)
eGFR: 74 mL/min/{1.73_m2} (ref >59)

## 2021-08-01 LAB — CBC WITH DIFFERENTIAL/PLATELET
Basophils Absolute: 0.1 10*3/uL (ref 0.0–0.2)
Basos: 1 %
EOS (ABSOLUTE): 0.2 10*3/uL (ref 0.0–0.4)
Eos: 3 %
Hematocrit: 44.3 % (ref 34.0–46.6)
Hemoglobin: 15.2 g/dL (ref 11.1–15.9)
Immature Grans (Abs): 0 10*3/uL (ref 0.0–0.1)
Immature Granulocytes: 0 %
Lymphocytes Absolute: 2.7 10*3/uL (ref 0.7–3.1)
Lymphs: 35 %
MCH: 29 pg (ref 26.6–33.0)
MCHC: 34.3 g/dL (ref 31.5–35.7)
MCV: 85 fL (ref 79–97)
Monocytes Absolute: 0.5 10*3/uL (ref 0.1–0.9)
Monocytes: 6 %
Neutrophils Absolute: 4.1 10*3/uL (ref 1.4–7.0)
Neutrophils: 55 %
Platelets: 307 10*3/uL (ref 150–450)
RBC: 5.24 x10E6/uL (ref 3.77–5.28)
RDW: 13.1 % (ref 11.7–15.4)
WBC: 7.6 10*3/uL (ref 3.4–10.8)

## 2021-08-01 LAB — LIPASE: Lipase: 36 U/L (ref 14–72)

## 2021-08-01 NOTE — Telephone Encounter (Signed)
Prior authorization was denied for phentermine. Patient sent mychart message.

## 2021-08-02 ENCOUNTER — Other Ambulatory Visit (HOSPITAL_COMMUNITY): Payer: Self-pay

## 2021-08-28 ENCOUNTER — Other Ambulatory Visit (HOSPITAL_COMMUNITY): Payer: Self-pay

## 2021-08-28 ENCOUNTER — Encounter (INDEPENDENT_AMBULATORY_CARE_PROVIDER_SITE_OTHER): Payer: Self-pay | Admitting: Family Medicine

## 2021-08-28 ENCOUNTER — Ambulatory Visit (INDEPENDENT_AMBULATORY_CARE_PROVIDER_SITE_OTHER): Payer: 59 | Admitting: Family Medicine

## 2021-08-28 ENCOUNTER — Other Ambulatory Visit: Payer: Self-pay

## 2021-08-28 VITALS — BP 113/75 | HR 79 | Temp 97.5°F | Ht 63.0 in | Wt 165.0 lb

## 2021-08-28 DIAGNOSIS — Z6834 Body mass index (BMI) 34.0-34.9, adult: Secondary | ICD-10-CM

## 2021-08-28 DIAGNOSIS — E559 Vitamin D deficiency, unspecified: Secondary | ICD-10-CM

## 2021-08-28 DIAGNOSIS — E7800 Pure hypercholesterolemia, unspecified: Secondary | ICD-10-CM

## 2021-08-28 DIAGNOSIS — R632 Polyphagia: Secondary | ICD-10-CM | POA: Diagnosis not present

## 2021-08-28 DIAGNOSIS — E669 Obesity, unspecified: Secondary | ICD-10-CM

## 2021-08-28 DIAGNOSIS — E890 Postprocedural hypothyroidism: Secondary | ICD-10-CM | POA: Diagnosis not present

## 2021-08-28 MED ORDER — TROKENDI XR 25 MG PO CP24
1.0000 | ORAL_CAPSULE | Freq: Every day | ORAL | 1 refills | Status: DC
  Filled 2021-08-28 – 2021-08-30 (×2): qty 90, 90d supply, fill #0

## 2021-08-28 MED ORDER — PHENTERMINE HCL 15 MG PO CAPS
15.0000 mg | ORAL_CAPSULE | Freq: Two times a day (BID) | ORAL | 0 refills | Status: DC
  Filled 2021-08-28 – 2021-08-30 (×2): qty 60, 30d supply, fill #0

## 2021-08-29 ENCOUNTER — Other Ambulatory Visit (HOSPITAL_COMMUNITY): Payer: Self-pay

## 2021-08-29 ENCOUNTER — Encounter (INDEPENDENT_AMBULATORY_CARE_PROVIDER_SITE_OTHER): Payer: Self-pay | Admitting: Family Medicine

## 2021-08-29 DIAGNOSIS — E890 Postprocedural hypothyroidism: Secondary | ICD-10-CM

## 2021-08-29 LAB — TSH: TSH: 0.023 u[IU]/mL — ABNORMAL LOW (ref 0.450–4.500)

## 2021-08-29 LAB — LIPID PANEL
Chol/HDL Ratio: 3.7 ratio (ref 0.0–4.4)
Cholesterol, Total: 224 mg/dL — ABNORMAL HIGH (ref 100–199)
HDL: 60 mg/dL (ref >39)
LDL Chol Calc (NIH): 150 mg/dL — ABNORMAL HIGH (ref 0–99)
Triglycerides: 80 mg/dL (ref 0–149)
VLDL Cholesterol Cal: 14 mg/dL (ref 5–40)

## 2021-08-29 LAB — T4, FREE: Free T4: 2.21 ng/dL — ABNORMAL HIGH (ref 0.82–1.77)

## 2021-08-29 LAB — VITAMIN D 25 HYDROXY (VIT D DEFICIENCY, FRACTURES): Vit D, 25-Hydroxy: 32.2 ng/mL (ref 30.0–100.0)

## 2021-08-29 MED ORDER — LEVOTHYROXINE SODIUM 137 MCG PO TABS
137.0000 ug | ORAL_TABLET | Freq: Every day | ORAL | 0 refills | Status: DC
  Filled 2021-08-29: qty 30, 30d supply, fill #0

## 2021-08-29 NOTE — Progress Notes (Signed)
Chief Complaint:   OBESITY Doris Aguilar is here to discuss her progress with her obesity treatment plan along with follow-up of her obesity related diagnoses. See Medical Weight Management Flowsheet for complete bioelectrical impedance results.  Today's visit was #: 5 Starting weight: 192 lbs Starting date: 05/26/2021 Weight change since last visit: 6 lbs Total lbs lost to date: 27 lbs Total weight loss percentage to date: -14.06%  Nutrition Plan: Keeping a food journal and adhering to recommended goals of 1000 calories and 85 grams of protein daily for 75% of the time. Activity: Cardio/strength training for 30-60 minutes 7 times per week.  Anti-obesity medications: phentermine 15 mg daily and topiramate 25 mg daily. Reported side effects: None.  Interim History: Doris Aguilar says she enjoyed her holiday/birthday.  She says she is tolerating her medications.  No side effects.  She is happy with her progress.  She is due for a lipid panel.  She says she is running 1 time per week now.  Assessment/Plan:   1. Polyphagia Controlled. Current treatment: phentermine 15 mg daily and topiramate 25 mg daily.    Plan:  Continue phentermine and topiramate.  Will refill both today, as per below.  She will continue to focus on protein-rich, low simple carbohydrate foods. We reviewed the importance of hydration, regular exercise for stress reduction, and restorative sleep.  - Refill phentermine 15 MG capsule; Take 1 capsule (15 mg total) by mouth in the morning and at bedtime.  Dispense: 60 capsule; Refill: 0 - Refill Topiramate ER (TROKENDI XR) 25 MG CP24; Take 1 capsule (25 mg total) by mouth daily.  Dispense: 90 capsule; Refill: 1  I have consulted the [place] Controlled Substances Registry for this patient, and feel the risk/benefit ratio today is favorable for proceeding with this prescription for a controlled substance. The patient understands monitoring parameters and red flags.   2. Pure  hypercholesterolemia Course: Not at goal. Lipid-lowering medications: None.   Plan: Dietary changes: Increase soluble fiber, decrease simple carbohydrates, decrease saturated fat. Exercise changes: Moderate to vigorous-intensity aerobic activity 150 minutes per week or as tolerated. We will continue to monitor along with PCP as it pertains to her weight loss journey.  Check lipid panel today.  Lab Results  Component Value Date   CHOL 224 (H) 08/28/2021   HDL 60 08/28/2021   LDLCALC 150 (H) 08/28/2021   LDLDIRECT 130 (H) 05/01/2013   TRIG 80 08/28/2021   CHOLHDL 3.7 08/28/2021   Lab Results  Component Value Date   ALT 14 07/31/2021   AST 16 07/31/2021   ALKPHOS 96 07/31/2021   BILITOT 0.5 07/31/2021   The 10-year ASCVD risk score (Arnett DK, et al., 2019) is: 2.7%   Values used to calculate the score:     Age: 60 years     Sex: Female     Is Non-Hispanic African American: No     Diabetic: No     Tobacco smoker: No     Systolic Blood Pressure: 829 mmHg     Is BP treated: No     HDL Cholesterol: 60 mg/dL     Total Cholesterol: 224 mg/dL  - Lipid panel  3. Postoperative hypothyroidism Medication: levothyroxine 150 mcg daily.   Plan: Patient was instructed not to take MVM or iron within 4 hours of taking thyroid medications. This issue is managed by Dr. Sheral Apley. We will continue to monitor alongside Endocrinology/PCP as it relates to her weight loss journey.  Will check TSH and free  T4 today.  Lab Results  Component Value Date   TSH 0.023 (L) 08/28/2021   - TSH - T4, free  4. Vitamin D deficiency Not at goal.  Doris Aguilar is taking a daily multivitamin.  Plan: Continue current OTC vitamin D supplementation.  Will check vitamin D level today.  Lab Results  Component Value Date   VD25OH 32.2 08/28/2021   VD25OH 31.1 05/26/2021   VD25OH 51 12/03/2011   - VITAMIN D 25 Hydroxy (Vit-D Deficiency, Fractures)  5. Obesity, current BMI 29.2  Course: Doris Aguilar is currently in  the action stage of change. As such, her goal is to continue with weight loss efforts.   Nutrition goals: She has agreed to keeping a food journal and adhering to recommended goals of 1000 calories and 85 grams of protein.   Exercise goals:  As is.  Behavioral modification strategies: increasing lean protein intake, decreasing simple carbohydrates, increasing vegetables, and increasing water intake.  Doris Aguilar has agreed to follow-up with our clinic in 4 weeks. She was informed of the importance of frequent follow-up visits to maximize her success with intensive lifestyle modifications for her multiple health conditions.   Doris Aguilar was informed we would discuss her lab results at her next visit unless there is a critical issue that needs to be addressed sooner. Doris Aguilar agreed to keep her next visit at the agreed upon time to discuss these results.  Objective:   Blood pressure 113/75, pulse 79, temperature (!) 97.5 F (36.4 C), temperature source Oral, height 5\' 3"  (1.6 m), weight 165 lb (74.8 kg), last menstrual period 05/23/2011, SpO2 97 %. Body mass index is 29.23 kg/m.  General: Cooperative, alert, well developed, in no acute distress. HEENT: Conjunctivae and lids unremarkable. Cardiovascular: Regular rhythm.  Lungs: Normal work of breathing. Neurologic: No focal deficits.   Lab Results  Component Value Date   CREATININE 0.90 07/31/2021   BUN 23 07/31/2021   NA 141 07/31/2021   K 4.3 07/31/2021   CL 106 07/31/2021   CO2 20 07/31/2021   Lab Results  Component Value Date   ALT 14 07/31/2021   AST 16 07/31/2021   ALKPHOS 96 07/31/2021   BILITOT 0.5 07/31/2021   Lab Results  Component Value Date   HGBA1C 5.4 05/26/2021   HGBA1C 5.3 05/01/2013   Lab Results  Component Value Date   INSULIN 7.4 05/26/2021   Lab Results  Component Value Date   TSH 0.023 (L) 08/28/2021   Lab Results  Component Value Date   CHOL 224 (H) 08/28/2021   HDL 60 08/28/2021   LDLCALC 150 (H)  08/28/2021   LDLDIRECT 130 (H) 05/01/2013   TRIG 80 08/28/2021   CHOLHDL 3.7 08/28/2021   Lab Results  Component Value Date   VD25OH 32.2 08/28/2021   VD25OH 31.1 05/26/2021   VD25OH 51 12/03/2011   Lab Results  Component Value Date   WBC 7.6 07/31/2021   HGB 15.2 07/31/2021   HCT 44.3 07/31/2021   MCV 85 07/31/2021   PLT 307 07/31/2021   Lab Results  Component Value Date   IRON 49 05/26/2021   TIBC 366 05/26/2021   FERRITIN 27 05/26/2021   Attestation Statements:   Reviewed by clinician on day of visit: allergies, medications, problem list, medical history, surgical history, family history, social history, and previous encounter notes.  I, Water quality scientist, CMA, am acting as transcriptionist for Briscoe Deutscher, DO  I have reviewed the above documentation for accuracy and completeness, and I agree with the above. -  Briscoe Deutscher, DO, MS, FAAFP, DABOM - Family and Bariatric Medicine.

## 2021-08-30 ENCOUNTER — Other Ambulatory Visit (HOSPITAL_COMMUNITY): Payer: Self-pay

## 2021-08-31 ENCOUNTER — Encounter (INDEPENDENT_AMBULATORY_CARE_PROVIDER_SITE_OTHER): Payer: Self-pay | Admitting: Family Medicine

## 2021-09-28 ENCOUNTER — Encounter (HOSPITAL_BASED_OUTPATIENT_CLINIC_OR_DEPARTMENT_OTHER): Payer: Self-pay | Admitting: *Deleted

## 2021-09-28 ENCOUNTER — Other Ambulatory Visit: Payer: Self-pay

## 2021-09-28 ENCOUNTER — Emergency Department (HOSPITAL_BASED_OUTPATIENT_CLINIC_OR_DEPARTMENT_OTHER): Payer: 59

## 2021-09-28 ENCOUNTER — Emergency Department (HOSPITAL_BASED_OUTPATIENT_CLINIC_OR_DEPARTMENT_OTHER)
Admission: EM | Admit: 2021-09-28 | Discharge: 2021-09-28 | Disposition: A | Discharge status: Home / Self Care | Payer: 59 | Attending: Emergency Medicine | Admitting: Emergency Medicine

## 2021-09-28 DIAGNOSIS — N201 Calculus of ureter: Secondary | ICD-10-CM | POA: Diagnosis not present

## 2021-09-28 DIAGNOSIS — K5730 Diverticulosis of large intestine without perforation or abscess without bleeding: Secondary | ICD-10-CM | POA: Diagnosis not present

## 2021-09-28 DIAGNOSIS — N132 Hydronephrosis with renal and ureteral calculous obstruction: Secondary | ICD-10-CM | POA: Diagnosis not present

## 2021-09-28 DIAGNOSIS — N83202 Unspecified ovarian cyst, left side: Secondary | ICD-10-CM | POA: Diagnosis not present

## 2021-09-28 DIAGNOSIS — N281 Cyst of kidney, acquired: Secondary | ICD-10-CM | POA: Diagnosis not present

## 2021-09-28 DIAGNOSIS — R109 Unspecified abdominal pain: Secondary | ICD-10-CM | POA: Diagnosis present

## 2021-09-28 LAB — URINALYSIS, MICROSCOPIC (REFLEX): RBC / HPF: >50 RBC/hpf (ref 0–5)

## 2021-09-28 LAB — CBC WITH DIFFERENTIAL/PLATELET
Abs Immature Granulocytes: 0.04 10*3/uL (ref 0.00–0.07)
Basophils Absolute: 0.1 10*3/uL (ref 0.0–0.1)
Basophils Relative: 0 %
Eosinophils Absolute: 0 10*3/uL (ref 0.0–0.5)
Eosinophils Relative: 0 %
HCT: 44 % (ref 36.0–46.0)
Hemoglobin: 14.9 g/dL (ref 12.0–15.0)
Immature Granulocytes: 0 %
Lymphocytes Relative: 11 %
Lymphs Abs: 1.2 10*3/uL (ref 0.7–4.0)
MCH: 28.3 pg (ref 26.0–34.0)
MCHC: 33.9 g/dL (ref 30.0–36.0)
MCV: 83.5 fL (ref 80.0–100.0)
Monocytes Absolute: 0.3 10*3/uL (ref 0.1–1.0)
Monocytes Relative: 2 %
Neutro Abs: 9.9 10*3/uL — ABNORMAL HIGH (ref 1.7–7.7)
Neutrophils Relative %: 87 %
Platelets: 257 10*3/uL (ref 150–400)
RBC: 5.27 MIL/uL — ABNORMAL HIGH (ref 3.87–5.11)
RDW: 13.1 % (ref 11.5–15.5)
WBC: 11.6 10*3/uL — ABNORMAL HIGH (ref 4.0–10.5)
nRBC: 0 % (ref 0.0–0.2)

## 2021-09-28 LAB — COMPREHENSIVE METABOLIC PANEL
ALT: 15 U/L (ref 0–44)
AST: 20 U/L (ref 15–41)
Albumin: 4.2 g/dL (ref 3.5–5.0)
Alkaline Phosphatase: 79 U/L (ref 38–126)
Anion gap: 9 (ref 5–15)
BUN: 23 mg/dL — ABNORMAL HIGH (ref 6–20)
CO2: 24 mmol/L (ref 22–32)
Calcium: 9.1 mg/dL (ref 8.9–10.3)
Chloride: 104 mmol/L (ref 98–111)
Creatinine, Ser: 0.85 mg/dL (ref 0.44–1.00)
GFR, Estimated: >60 mL/min (ref >60)
Glucose, Bld: 125 mg/dL — ABNORMAL HIGH (ref 70–99)
Potassium: 3.7 mmol/L (ref 3.5–5.1)
Sodium: 137 mmol/L (ref 135–145)
Total Bilirubin: 0.6 mg/dL (ref 0.3–1.2)
Total Protein: 7.3 g/dL (ref 6.5–8.1)

## 2021-09-28 LAB — URINALYSIS, ROUTINE W REFLEX MICROSCOPIC
Glucose, UA: NEGATIVE mg/dL
Ketones, ur: NEGATIVE mg/dL
Leukocytes,Ua: NEGATIVE
Nitrite: NEGATIVE
Protein, ur: 30 mg/dL — AB
Specific Gravity, Urine: >1.03 — ABNORMAL HIGH (ref 1.005–1.030)
pH: 6.5 (ref 5.0–8.0)

## 2021-09-28 LAB — LIPASE, BLOOD: Lipase: 31 U/L (ref 11–51)

## 2021-09-28 MED ORDER — HYDROMORPHONE HCL 4 MG PO TABS
4.0000 mg | ORAL_TABLET | ORAL | 0 refills | Status: DC | PRN
Start: 2021-09-28 — End: 2021-11-10

## 2021-09-28 MED ORDER — FENTANYL CITRATE PF 50 MCG/ML IJ SOSY
50.0000 ug | PREFILLED_SYRINGE | Freq: Once | INTRAMUSCULAR | Status: DC
  Filled 2021-09-28: qty 1

## 2021-09-28 MED ORDER — SODIUM CHLORIDE 0.9 % IV BOLUS: 1000.0000 mL | Freq: Once | INTRAVENOUS | Status: DC

## 2021-09-28 MED ORDER — HYDROMORPHONE HCL 1 MG/ML IJ SOLN
2.0000 mg | Freq: Once | INTRAMUSCULAR | Status: AC
Start: 2021-09-28 — End: 2021-09-28
  Administered 2021-09-28: 2 mg via INTRAMUSCULAR
  Filled 2021-09-28: qty 2

## 2021-09-28 MED ORDER — IBUPROFEN 600 MG PO TABS: 600.0000 mg | ORAL_TABLET | Freq: Three times a day (TID) | ORAL | 0 refills | Status: DC | PRN

## 2021-09-28 NOTE — ED Triage Notes (Signed)
Left lower abdomen pain with radiation into her left flank x 3 hours.

## 2021-09-28 NOTE — ED Notes (Signed)
Unable to obtain IV x 2 attempts; blood was obtained with first stick; EPD made aware and orders adjusted.

## 2021-09-28 NOTE — ED Provider Notes (Signed)
[facility] EMERGENC[facility] DEPARTMENT Provider Note   CSN: [id number] Arrival date & time: 09/28/21  1635     History  Chief Complaint  Patient presents with   Back Pain    Doris Aguilar is a 61 y.o. female.  HPI 61 year old female presents with severe left-sided abdominal pain.  Started acutely at around 2 PM today.  It has been severe and sharp.  She is now also feeling pain in her left flank/back.  No dysuria or hematuria up until she got here and when she gave a urine sample she noticed blood and it stung a little, though she thinks that might of been the wipes they used.  She has been nauseated but no vomiting.  No fevers.  No similar symptoms in the past or prior history of kidney stones.  Nothing she does makes it better or worse.  She took a leftover tramadol from previous surgery that had no effect.  Home Medications Prior to Admission medications   Medication Sig Start Date End Date Taking? Authorizing Provider  levothyroxine (SYNTHROID) 137 MCG tablet Take 1 tablet (137 mcg total) by mouth daily before breakfast. 08/29/21   Briscoe Deutscher, DO  loratadine (CLARITIN) 10 MG tablet Take 10 mg by mouth daily as needed for allergies.    [provider]  Multiple Vitamin (MULTIVITAMIN) tablet Take 1 tablet by mouth daily. Womens 50+ daily    [provider]  pantoprazole (PROTONIX) 40 MG tablet Take 1 tablet (40 mg total) by mouth daily. 02/01/21     phentermine 15 MG capsule Take 1 capsule (15 mg total) by mouth in the morning and at bedtime. 08/28/21   Briscoe Deutscher, DO  Topiramate ER (TROKENDI XR) 25 MG CP24 Take 1 capsule (25 mg total) by mouth daily. 08/28/21   Briscoe Deutscher, DO      Allergies    Penicillins    Review of Systems   Review of Systems  Constitutional:  Positive for chills. Negative for fever.  Gastrointestinal:  Positive for abdominal pain and nausea. Negative for vomiting.  Genitourinary:  Positive for flank pain and hematuria.   Musculoskeletal:  Positive for back pain.   Physical Exam Updated Vital Signs BP (!) 144/98 (BP Location: Right Arm)    Pulse 75    Temp 97.6 F (36.4 C)    Resp 18    Ht 5\' 3"  (1.6 m)    Wt 74.8 kg    LMP 05/23/2011    SpO2 100%    BMI 29.21 kg/m  Physical Exam Vitals and nursing note reviewed.  Constitutional:      Appearance: She is well-developed.     Comments: Appears uncomfortable/in pain  HENT:     Head: Normocephalic and atraumatic.  Cardiovascular:     Rate and Rhythm: Normal rate and regular rhythm.     Heart sounds: Normal heart sounds.  Pulmonary:     Effort: Pulmonary effort is normal.     Breath sounds: Normal breath sounds.  Abdominal:     Palpations: Abdomen is soft.     Tenderness: There is abdominal tenderness in the left upper quadrant and left lower quadrant. There is no right CVA tenderness or left CVA tenderness.  Skin:    General: Skin is warm and dry.  Neurological:     Mental Status: She is alert.    ED Results / Procedures / Treatments   Labs (all labs ordered are listed, but only abnormal results are displayed) Labs Reviewed  URINALYSIS, ROUTINE W REFLEX MICROSCOPIC - Abnormal; Notable for the following components:      Result Value   Color, Urine BROWN (*)    APPearance CLOUDY (*)    Specific Gravity, Urine >1.030 (*)    Hgb urine dipstick LARGE (*)    Bilirubin Urine SMALL (*)    Protein, ur 30 (*)    All other components within normal limits  URINALYSIS, MICROSCOPIC (REFLEX) - Abnormal; Notable for the following components:   Bacteria, UA RARE (*)    All other components within normal limits    EKG None  Radiology No results found.  Procedures Procedures    Medications Ordered in ED Medications  sodium chloride 0.9 % bolus 1,000 mL (has no administration in time range)  fentaNYL (SUBLIMAZE) injection 50 mcg (has no administration in time range)    ED Course/ Medical Decision Making/ A&P                             Patient appears to have a symptomatic left ureteral stone.  History was assisted by patient and significant other.  This was the most likely diagnosis based on her initial presentation but because of her age and never having a prior history of a stone, CT was obtained to rule out other causes as well.  I personally reviewed and interpreted these images and agree with radiology that there is a ureteral stone.  Questionable enteritis though she does not really have symptoms of this.  She is having symptoms of constipation.  There was difficult IV access at first with nursing and so she was given a dose of IM Dilaudid and her pain is much better controlled.  Labs were obtained and were personally reviewed and interpreted and show a mild leukocytosis but I doubt this is clinically significant as I have very low suspicion of an infection.  Her urinalysis shows blood but no infection.  Renal function is okay.  Given good pain control, small stone, I do not think she will need admission and I think she is stable for discharge home with pain control.  PDMP was reviewed and she will be prescribed a short course of oral Dilaudid based on her prior intolerance to hydrocodone/oxycodone with itching.  She will also be prescribed ibuprofen.  We discussed return precautions but she appears stable for discharge home.        Final Clinical Impression(s) / ED Diagnoses Final diagnoses:  None    Rx / DC Orders ED Discharge Orders     None         Sherwood Gambler, MD 09/28/21 [phone]

## 2021-09-28 NOTE — Discharge Instructions (Signed)
If you develop new or worsening or uncontrolled pain, vomiting, fever, urinary tract infection symptoms, or any other new/concerning symptoms then return to the ER for evaluation.

## 2021-10-02 ENCOUNTER — Encounter (INDEPENDENT_AMBULATORY_CARE_PROVIDER_SITE_OTHER): Payer: Self-pay | Admitting: Family Medicine

## 2021-10-04 NOTE — Telephone Encounter (Signed)
Dr.Wallace °

## 2021-10-09 ENCOUNTER — Ambulatory Visit (INDEPENDENT_AMBULATORY_CARE_PROVIDER_SITE_OTHER): Payer: 59 | Admitting: Family Medicine

## 2021-10-09 ENCOUNTER — Encounter (INDEPENDENT_AMBULATORY_CARE_PROVIDER_SITE_OTHER): Payer: Self-pay | Admitting: Family Medicine

## 2021-10-09 ENCOUNTER — Other Ambulatory Visit (HOSPITAL_COMMUNITY): Payer: Self-pay

## 2021-10-09 ENCOUNTER — Other Ambulatory Visit: Payer: Self-pay

## 2021-10-09 VITALS — BP 117/75 | HR 76 | Temp 98.1°F | Ht 63.0 in | Wt 162.0 lb

## 2021-10-09 DIAGNOSIS — R1032 Left lower quadrant pain: Secondary | ICD-10-CM | POA: Diagnosis not present

## 2021-10-09 DIAGNOSIS — E890 Postprocedural hypothyroidism: Secondary | ICD-10-CM

## 2021-10-09 DIAGNOSIS — R632 Polyphagia: Secondary | ICD-10-CM

## 2021-10-09 DIAGNOSIS — E669 Obesity, unspecified: Secondary | ICD-10-CM | POA: Diagnosis not present

## 2021-10-09 DIAGNOSIS — N83209 Unspecified ovarian cyst, unspecified side: Secondary | ICD-10-CM | POA: Diagnosis not present

## 2021-10-09 DIAGNOSIS — Z6834 Body mass index (BMI) 34.0-34.9, adult: Secondary | ICD-10-CM

## 2021-10-09 DIAGNOSIS — R102 Pelvic and perineal pain: Secondary | ICD-10-CM

## 2021-10-09 DIAGNOSIS — K219 Gastro-esophageal reflux disease without esophagitis: Secondary | ICD-10-CM

## 2021-10-09 DIAGNOSIS — N200 Calculus of kidney: Secondary | ICD-10-CM

## 2021-10-09 DIAGNOSIS — Z780 Asymptomatic menopausal state: Secondary | ICD-10-CM

## 2021-10-09 DIAGNOSIS — Z6828 Body mass index (BMI) 28.0-28.9, adult: Secondary | ICD-10-CM

## 2021-10-09 MED ORDER — PHENTERMINE HCL 37.5 MG PO TABS
37.5000 mg | ORAL_TABLET | Freq: Every day | ORAL | 0 refills | Status: DC
  Filled 2021-10-09: qty 30, 30d supply, fill #0

## 2021-10-09 MED ORDER — LEVOTHYROXINE SODIUM 137 MCG PO TABS
137.0000 ug | ORAL_TABLET | Freq: Every day | ORAL | 3 refills | Status: AC
  Filled 2021-10-09: qty 90, 90d supply, fill #0
  Filled 2022-01-13: qty 90, 90d supply, fill #1

## 2021-10-09 MED ORDER — PANTOPRAZOLE SODIUM 40 MG PO TBEC
40.0000 mg | DELAYED_RELEASE_TABLET | Freq: Every day | ORAL | 3 refills | Status: AC
  Filled 2021-10-09: qty 90, 90d supply, fill #0

## 2021-10-10 ENCOUNTER — Other Ambulatory Visit (HOSPITAL_COMMUNITY): Payer: Self-pay

## 2021-10-10 MED ORDER — TAMSULOSIN HCL 0.4 MG PO CAPS
0.4000 mg | ORAL_CAPSULE | Freq: Every day | ORAL | 3 refills | Status: DC
  Filled 2021-10-10: qty 30, 30d supply, fill #0

## 2021-10-10 NOTE — Telephone Encounter (Signed)
Dr.Wallace °

## 2021-10-11 ENCOUNTER — Other Ambulatory Visit: Payer: Self-pay

## 2021-10-11 ENCOUNTER — Ambulatory Visit (INDEPENDENT_AMBULATORY_CARE_PROVIDER_SITE_OTHER): Payer: 59

## 2021-10-11 DIAGNOSIS — N83209 Unspecified ovarian cyst, unspecified side: Secondary | ICD-10-CM | POA: Diagnosis not present

## 2021-10-11 DIAGNOSIS — I700 Atherosclerosis of aorta: Secondary | ICD-10-CM | POA: Diagnosis not present

## 2021-10-11 DIAGNOSIS — R1032 Left lower quadrant pain: Secondary | ICD-10-CM | POA: Diagnosis not present

## 2021-10-11 DIAGNOSIS — Z780 Asymptomatic menopausal state: Secondary | ICD-10-CM | POA: Diagnosis not present

## 2021-10-11 DIAGNOSIS — R102 Pelvic and perineal pain: Secondary | ICD-10-CM | POA: Diagnosis not present

## 2021-10-11 DIAGNOSIS — R109 Unspecified abdominal pain: Secondary | ICD-10-CM | POA: Diagnosis not present

## 2021-10-11 DIAGNOSIS — K219 Gastro-esophageal reflux disease without esophagitis: Secondary | ICD-10-CM

## 2021-10-11 DIAGNOSIS — N200 Calculus of kidney: Secondary | ICD-10-CM | POA: Diagnosis not present

## 2021-10-11 MED ORDER — IOHEXOL 300 MG/ML  SOLN
100.0000 mL | Freq: Once | INTRAMUSCULAR | Status: AC | PRN
  Administered 2021-10-11: 80 mL via INTRAVENOUS

## 2021-10-13 DIAGNOSIS — Z23 Encounter for immunization: Secondary | ICD-10-CM | POA: Diagnosis not present

## 2021-10-13 DIAGNOSIS — E039 Hypothyroidism, unspecified: Secondary | ICD-10-CM | POA: Diagnosis not present

## 2021-10-13 DIAGNOSIS — Z124 Encounter for screening for malignant neoplasm of cervix: Secondary | ICD-10-CM | POA: Diagnosis not present

## 2021-10-13 DIAGNOSIS — Z0000 Encounter for general adult medical examination without abnormal findings: Secondary | ICD-10-CM | POA: Diagnosis not present

## 2021-10-13 DIAGNOSIS — Z01419 Encounter for gynecological examination (general) (routine) without abnormal findings: Secondary | ICD-10-CM | POA: Diagnosis not present

## 2021-10-13 DIAGNOSIS — D351 Benign neoplasm of parathyroid gland: Secondary | ICD-10-CM | POA: Diagnosis not present

## 2021-10-16 NOTE — Progress Notes (Signed)
Chief Complaint:   OBESITY Doris Aguilar is here to discuss her progress with her obesity treatment plan along with follow-up of her obesity related diagnoses. See Medical Weight Management Flowsheet for complete bioelectrical impedance results.  Today's visit was #: 6 Starting weight: 192 lbs Starting date: 05/26/2021 Weight change since last visit: 3 lbs Total lbs lost to date: 30 lbs Total weight loss percentage to date: -15.63%  Nutrition Plan: Keeping a food journal and adhering to recommended goals of 1000 calories and 85 grams of protein daily for 80% of the time. Activity: Cardio/strength training for 30-60 minutes 7 time per week. Anti-obesity medications: phentermine 15 mg twice daily. Reported side effects: None.  Interim History: Recent ED visit reviewed together.  Assessment/Plan:   1. Postoperative hypothyroidism Medication: levothyroxine 137 mcg daily.   Plan: The current medical regimen is effective;  continue present plan and medications.  - Refill levothyroxine (SYNTHROID) 137 MCG tablet; Take 1 tablet (137 mcg total) by mouth daily before breakfast.  Dispense: 90 tablet; Refill: 3  2. Polyphagia Controlled. Current treatment: phentermine 15 mg twice daily.    Plan: She will continue to focus on protein-rich, low simple carbohydrate foods. We reviewed the importance of hydration, regular exercise for stress reduction, and restorative sleep.  - Refill phentermine (ADIPEX-P) 37.5 MG tablet; Take 1 tablet (37.5 mg total) by mouth daily.  Dispense: 30 tablet; Refill: 0  I have consulted the [place] Controlled Substances Registry for this patient, and feel the risk/benefit ratio today is favorable for proceeding with this prescription for a controlled substance. The patient understands monitoring parameters and red flags.   3. Pelvic pain With bloating. Given recent symptoms, postmenopausal status, and hypodense 3.7 cm left ovarian cyst; will obtain CT to rule out  ovarian cancer.  Plan:  Will order STAT CT abdomen and pelvis with oral and IV contrast.  - CT Abdomen Pelvis W Contrast  4. Left lower quadrant abdominal pain STAT CT abdomen and pelvis has been ordered as above. We also discussed a bowel regimen for constipation.   - CT Abdomen Pelvis W Contrast  5. Gastroesophageal reflux disease without esophagitis Doris Aguilar takes Protonix 40 mg daily for GERD.  Continue present plan and medications.  - Refill pantoprazole (PROTONIX) 40 MG tablet; Take 1 tablet (40 mg total) by mouth daily.  Dispense: 90 tablet; Refill: 3 - CT Abdomen Pelvis W Contrast  6. Kidney stones Flomax 0.4 mg once daily, to facilitate stone passage and reduce discomfort.  - Famsulosin (FLOMAX) 0.4 MG CAPS capsule; Take 1 capsule (0.4 mg total) by mouth daily.  Dispense: 30 capsule; Refill: 3  9. Obesity with current BMI of 28.7  Course: Doris Aguilar is currently in the action stage of change. As such, her goal is to continue with weight loss efforts.   Nutrition goals: She has agreed to keeping a food journal and adhering to recommended goals of 1000 calories and 85 grams of protein.   Exercise goals:  As is.  Behavioral modification strategies: increasing lean protein intake, decreasing simple carbohydrates, and increasing vegetables.  Doris Aguilar has agreed to follow-up with our clinic in 4 weeks. She was informed of the importance of frequent follow-up visits to maximize her success with intensive lifestyle modifications for her multiple health conditions.   Objective:   Blood pressure 117/75, pulse 76, temperature 98.1 F (36.7 C), temperature source Oral, height 5\' 3"  (1.6 m), weight 162 lb (73.5 kg), last menstrual period 05/23/2011, SpO2 97 %. Body mass index  is 28.7 kg/m.  General: Cooperative, alert, well developed, in no acute distress. HEENT: Conjunctivae and lids unremarkable. Cardiovascular: Regular rhythm.  Lungs: Normal work of breathing. Neurologic: No  focal deficits.   Lab Results  Component Value Date   CREATININE 0.85 09/28/2021   BUN 23 (H) 09/28/2021   NA 137 09/28/2021   K 3.7 09/28/2021   CL 104 09/28/2021   CO2 24 09/28/2021   Lab Results  Component Value Date   ALT 15 09/28/2021   AST 20 09/28/2021   ALKPHOS 79 09/28/2021   BILITOT 0.6 09/28/2021   Lab Results  Component Value Date   HGBA1C 5.4 05/26/2021   HGBA1C 5.3 05/01/2013   Lab Results  Component Value Date   INSULIN 7.4 05/26/2021   Lab Results  Component Value Date   TSH 0.023 (L) 08/28/2021   Lab Results  Component Value Date   CHOL 224 (H) 08/28/2021   HDL 60 08/28/2021   LDLCALC 150 (H) 08/28/2021   LDLDIRECT 130 (H) 05/01/2013   TRIG 80 08/28/2021   CHOLHDL 3.7 08/28/2021   Lab Results  Component Value Date   VD25OH 32.2 08/28/2021   VD25OH 31.1 05/26/2021   VD25OH 51 12/03/2011   Lab Results  Component Value Date   WBC 11.6 (H) 09/28/2021   HGB 14.9 09/28/2021   HCT 44.0 09/28/2021   MCV 83.5 09/28/2021   PLT 257 09/28/2021   Lab Results  Component Value Date   IRON 49 05/26/2021   TIBC 366 05/26/2021   FERRITIN 27 05/26/2021   Attestation Statements:   Reviewed by clinician on day of visit: allergies, medications, problem list, medical history, surgical history, family history, social history, and previous encounter notes.  I, Water quality scientist, CMA, am acting as transcriptionist for Briscoe Deutscher, DO  I have reviewed the above documentation for accuracy and completeness, and I agree with the above. -  Briscoe Deutscher, DO, MS, FAAFP, DABOM - Family and Bariatric Medicine.

## 2021-10-20 DIAGNOSIS — Z1231 Encounter for screening mammogram for malignant neoplasm of breast: Secondary | ICD-10-CM | POA: Diagnosis not present

## 2021-11-07 ENCOUNTER — Ambulatory Visit (INDEPENDENT_AMBULATORY_CARE_PROVIDER_SITE_OTHER): Payer: 59 | Admitting: Family Medicine

## 2021-11-09 ENCOUNTER — Other Ambulatory Visit (INDEPENDENT_AMBULATORY_CARE_PROVIDER_SITE_OTHER): Payer: Self-pay | Admitting: Family Medicine

## 2021-11-09 ENCOUNTER — Encounter (INDEPENDENT_AMBULATORY_CARE_PROVIDER_SITE_OTHER): Payer: Self-pay | Admitting: Family Medicine

## 2021-11-09 ENCOUNTER — Other Ambulatory Visit (HOSPITAL_COMMUNITY): Payer: Self-pay

## 2021-11-09 DIAGNOSIS — R632 Polyphagia: Secondary | ICD-10-CM

## 2021-11-09 NOTE — Telephone Encounter (Signed)
Refill request

## 2021-11-10 ENCOUNTER — Other Ambulatory Visit (INDEPENDENT_AMBULATORY_CARE_PROVIDER_SITE_OTHER): Payer: Self-pay | Admitting: Family Medicine

## 2021-11-10 ENCOUNTER — Other Ambulatory Visit (HOSPITAL_COMMUNITY): Payer: Self-pay

## 2021-11-10 DIAGNOSIS — R632 Polyphagia: Secondary | ICD-10-CM

## 2021-11-10 MED ORDER — PHENTERMINE HCL 37.5 MG PO TABS
37.5000 mg | ORAL_TABLET | Freq: Every day | ORAL | 0 refills | Status: DC
  Filled 2021-11-10: qty 30, 30d supply, fill #0

## 2021-11-13 ENCOUNTER — Other Ambulatory Visit (HOSPITAL_COMMUNITY): Payer: Self-pay

## 2021-12-06 ENCOUNTER — Ambulatory Visit (INDEPENDENT_AMBULATORY_CARE_PROVIDER_SITE_OTHER): Payer: 59 | Admitting: Family Medicine

## 2021-12-06 ENCOUNTER — Other Ambulatory Visit: Payer: Self-pay

## 2021-12-06 ENCOUNTER — Other Ambulatory Visit (HOSPITAL_COMMUNITY): Payer: Self-pay

## 2021-12-06 ENCOUNTER — Encounter (INDEPENDENT_AMBULATORY_CARE_PROVIDER_SITE_OTHER): Payer: Self-pay | Admitting: Family Medicine

## 2021-12-06 VITALS — BP 99/65 | HR 83 | Temp 97.5°F | Ht 63.0 in | Wt 158.0 lb

## 2021-12-06 DIAGNOSIS — N83202 Unspecified ovarian cyst, left side: Secondary | ICD-10-CM | POA: Diagnosis not present

## 2021-12-06 DIAGNOSIS — E669 Obesity, unspecified: Secondary | ICD-10-CM | POA: Diagnosis not present

## 2021-12-06 DIAGNOSIS — Z6828 Body mass index (BMI) 28.0-28.9, adult: Secondary | ICD-10-CM

## 2021-12-06 DIAGNOSIS — E890 Postprocedural hypothyroidism: Secondary | ICD-10-CM

## 2021-12-06 DIAGNOSIS — R632 Polyphagia: Secondary | ICD-10-CM

## 2021-12-06 MED ORDER — PHENTERMINE HCL 37.5 MG PO TABS
ORAL_TABLET | ORAL | 0 refills | Status: AC
  Filled 2021-12-06 – 2021-12-11 (×2): qty 135, 90d supply, fill #0

## 2021-12-08 ENCOUNTER — Other Ambulatory Visit (HOSPITAL_COMMUNITY): Payer: Self-pay

## 2021-12-11 ENCOUNTER — Other Ambulatory Visit (HOSPITAL_COMMUNITY): Payer: Self-pay

## 2021-12-11 NOTE — Progress Notes (Signed)
Chief Complaint:   OBESITY Doris Aguilar is here to discuss her progress with her obesity treatment plan along with follow-up of her obesity related diagnoses.   Today's visit was #: 7 Starting weight: 192 lbs Starting date: 05/26/2021 Today's weight: 158 lbs Today's date: 12/06/2021 Weight change since last visit: -4 lbs Total lbs lost to date: 34 lbs Body mass index is 27.99 kg/m.  Total weight loss percentage to date: -17.71%  Current Meal Plan: keeping a food journal and adhering to recommended goals of 1200 calories and 85 grams of protein for 80% of the time.  Current Exercise Plan: Strength training/cardio/pilates for 30-60 minutes 7 times per week. Current Anti-Obesity Medications: Phentermine 37.5 mg daily. Side effects: None.  Interim History:Doris Aguilar reports that she has been feeling good. No more renal colic. She stopped taking Topiramate and her constipation resolved. Phentermine works well until early afternoon. She is using mindful eating techniques. Her CT abdomin/pelvis was reassuring.  Assessment/Plan:   1. Polyphagia Not at goal. Current treatment: Phentermine 37.5 mg daily.   Plan: She will continue to focus on protein-rich, low simple carbohydrate foods. We reviewed the importance of hydration, regular exercise for stress reduction, and restorative sleep. Start taking phentermine, as per below.  - Start phentermine (ADIPEX-P) 37.5 MG tablet; Take 1 tablet by mouth in morning and 1/2 tablet in afternoon.  Dispense: 135 tablet; Refill: 0  2. Postoperative hypothyroidism Medication: levothyroxine 137 mcg daily.    Plan: The current medical regimen is effective;  continue present plan and medications.  3. Cyst of left ovary CT reviewed with patient. The patient understands monitoring parameters and red flags.   4. Obesity with current BMI of 28 Course: Doris Aguilar is currently in the action stage of change.  As such, her goal is to continue with weight loss efforts.   Nutrition goals: She has agreed to keeping a food journal and adhering to recommended goals of 1200 calories and 85 grams of protein.   Exercise goals: As is.  Behavioral modification strategies: increasing lean protein intake, decreasing simple carbohydrates, and increasing vegetables.  Doris Aguilar has agreed to follow-up with our clinic in 6 weeks. She was informed of the importance of frequent follow-up visits to maximize her success with intensive lifestyle modifications for her multiple health conditions.   Objective:   Blood pressure 99/65, pulse 83, temperature (!) 97.5 F (36.4 C), temperature source Oral, height '5\' 3"'$  (1.6 m), weight 158 lb (71.7 kg), last menstrual period 05/23/2011, SpO2 (!) 33 %. Body mass index is 27.99 kg/m.  General: Cooperative, alert, well developed, in no acute distress. HEENT: Conjunctivae and lids unremarkable. Cardiovascular: Regular rhythm.  Lungs: Normal work of breathing. Neurologic: No focal deficits.   Lab Results  Component Value Date   CREATININE 0.85 09/28/2021   BUN 23 (H) 09/28/2021   NA 137 09/28/2021   K 3.7 09/28/2021   CL 104 09/28/2021   CO2 24 09/28/2021   Lab Results  Component Value Date   ALT 15 09/28/2021   AST 20 09/28/2021   ALKPHOS 79 09/28/2021   BILITOT 0.6 09/28/2021   Lab Results  Component Value Date   HGBA1C  5.4 05/26/2021   HGBA1C 5.3 05/01/2013   Lab Results  Component Value Date   INSULIN 7.4 05/26/2021   Lab Results  Component Value Date   TSH 0.023 (L) 08/28/2021   Lab Results  Component Value Date   CHOL 224 (H) 08/28/2021   HDL 60 08/28/2021   LDLCALC 150 (H) 08/28/2021   LDLDIRECT 130 (H) 05/01/2013   TRIG 80 08/28/2021   CHOLHDL 3.7 08/28/2021   Lab Results  Component Value Date   VD25OH 32.2 08/28/2021   VD25OH 31.1 05/26/2021   VD25OH 51 12/03/2011   Lab Results  Component Value Date   WBC 11.6 (H) 09/28/2021   HGB  14.9 09/28/2021   HCT 44.0 09/28/2021   MCV 83.5 09/28/2021   PLT 257 09/28/2021   Lab Results  Component Value Date   IRON 49 05/26/2021   TIBC 366 05/26/2021   FERRITIN 27 05/26/2021   Attestation Statements:   Reviewed by clinician on day of visit: allergies, medications, problem list, medical history, surgical history, family history, social history, and previous encounter notes.  Leodis Binet Friedenbach, CMA, am acting as Location manager for [employer], DO.  I have reviewed the above documentation for accuracy and completeness, and I agree with the above. -  Briscoe Deutscher, DO, MS, FAAFP, DABOM - Family and Bariatric Medicine.

## 2022-01-15 ENCOUNTER — Other Ambulatory Visit (HOSPITAL_COMMUNITY): Payer: Self-pay

## 2022-01-18 ENCOUNTER — Other Ambulatory Visit (HOSPITAL_COMMUNITY): Payer: Self-pay

## 2022-01-18 DIAGNOSIS — R632 Polyphagia: Secondary | ICD-10-CM | POA: Diagnosis not present

## 2022-01-18 DIAGNOSIS — F5081 Binge eating disorder: Secondary | ICD-10-CM | POA: Diagnosis not present

## 2022-01-18 DIAGNOSIS — Z8639 Personal history of other endocrine, nutritional and metabolic disease: Secondary | ICD-10-CM | POA: Diagnosis not present

## 2022-01-18 DIAGNOSIS — E890 Postprocedural hypothyroidism: Secondary | ICD-10-CM | POA: Diagnosis not present

## 2022-01-18 DIAGNOSIS — Z6827 Body mass index (BMI) 27.0-27.9, adult: Secondary | ICD-10-CM | POA: Diagnosis not present

## 2022-01-18 MED ORDER — VYVANSE 20 MG PO CAPS
20.0000 mg | ORAL_CAPSULE | Freq: Every morning | ORAL | 0 refills | Status: DC
Start: 2022-01-18 — End: 2022-03-21
  Filled 2022-01-18: qty 30, 30d supply, fill #0

## 2022-02-15 DIAGNOSIS — F5081 Binge eating disorder: Secondary | ICD-10-CM | POA: Diagnosis not present

## 2022-02-15 DIAGNOSIS — R948 Abnormal results of function studies of other organs and systems: Secondary | ICD-10-CM | POA: Diagnosis not present

## 2022-02-15 DIAGNOSIS — R0602 Shortness of breath: Secondary | ICD-10-CM | POA: Diagnosis not present

## 2022-02-15 DIAGNOSIS — Z6827 Body mass index (BMI) 27.0-27.9, adult: Secondary | ICD-10-CM | POA: Diagnosis not present

## 2022-02-15 DIAGNOSIS — Z8639 Personal history of other endocrine, nutritional and metabolic disease: Secondary | ICD-10-CM | POA: Diagnosis not present

## 2022-02-15 DIAGNOSIS — E039 Hypothyroidism, unspecified: Secondary | ICD-10-CM | POA: Diagnosis not present

## 2022-02-15 DIAGNOSIS — F418 Other specified anxiety disorders: Secondary | ICD-10-CM | POA: Diagnosis not present

## 2022-02-16 ENCOUNTER — Other Ambulatory Visit (HOSPITAL_COMMUNITY): Payer: Self-pay

## 2022-02-19 ENCOUNTER — Other Ambulatory Visit (HOSPITAL_COMMUNITY): Payer: Self-pay

## 2022-02-20 ENCOUNTER — Other Ambulatory Visit (HOSPITAL_COMMUNITY): Payer: Self-pay

## 2022-02-20 MED ORDER — VYVANSE 50 MG PO CAPS
50.0000 mg | ORAL_CAPSULE | Freq: Every morning | ORAL | 0 refills | Status: DC
  Filled 2022-02-20: qty 30, 30d supply, fill #0

## 2022-03-15 IMAGING — CT CT CARDIAC CORONARY ARTERY CALCIUM SCORE
3 series · 14 of 20 positions shown, 16 images · non-contrast
Comparison: None.

CLINICAL DATA: 59-year-old Caucasian female with history
hyperlipidemia and family history of heart disease.

EXAM:
CT CARDIAC CORONARY ARTERY CALCIUM SCORE
TECHNIQUE: Non-contrast imaging through the heart was performed using
prospective ECG gating. Image post processing was performed on an
independent workstation, allowing for quantitative analysis of the
heart and coronary arteries. Note that this exam targets the heart
and the chest was not imaged in its entirety.

[Series 2: calcium scoring 2.00 qr36 bestdiast 68% hrt calciu · axial · 0.37mm/px · z∈[+1796,+1880]mm · 4 of 70 slices shown]
[im 14/70  vessel]
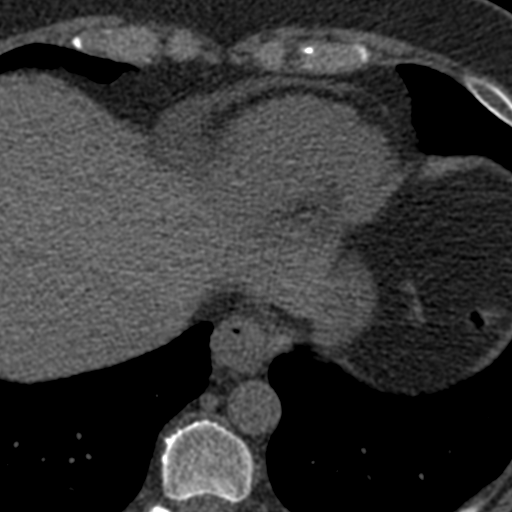
[im 28/70  vessel]
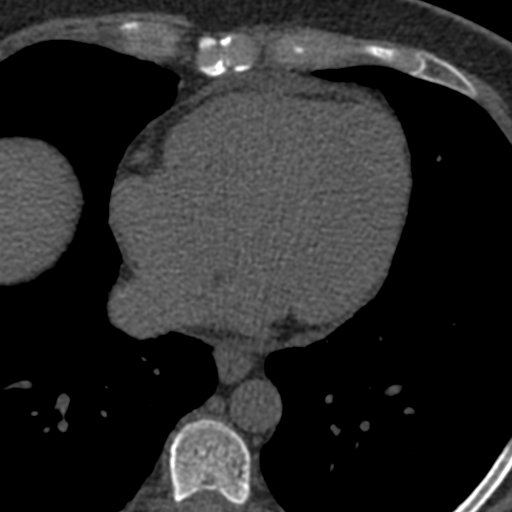
[im 42/70  vessel]
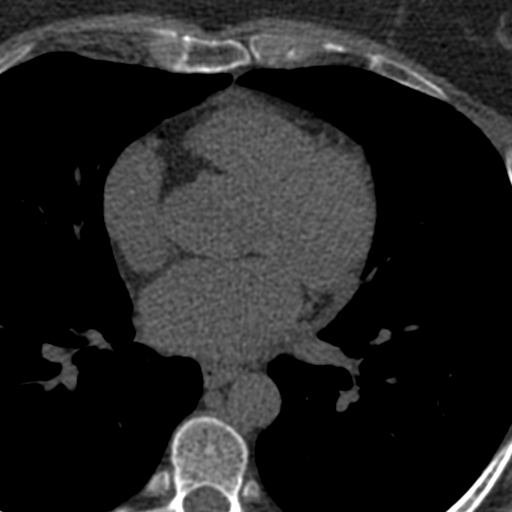
[im 56/70  vessel]
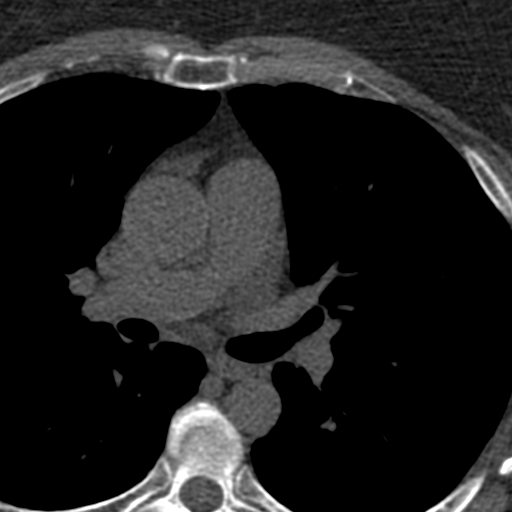

[Series 3: calcium scoring 2.00 br40 bestdiast 68% axial · axial · 0.58mm/px · z∈[+1792,+1884]mm · 5 of 70 slices shown, 7 images]
[im 12/70  vessel]
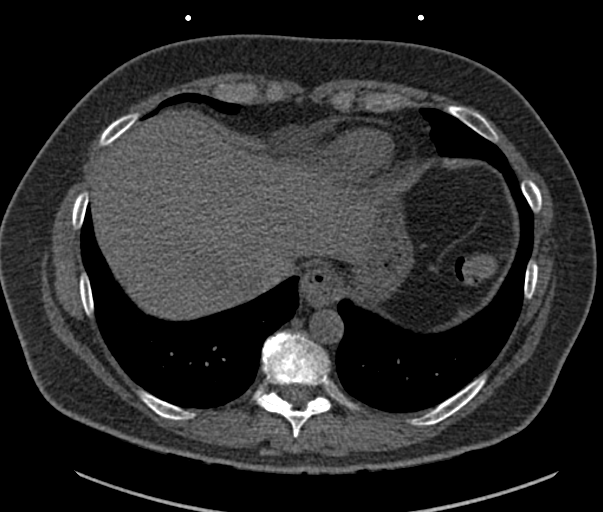
[im 12/70  lung]
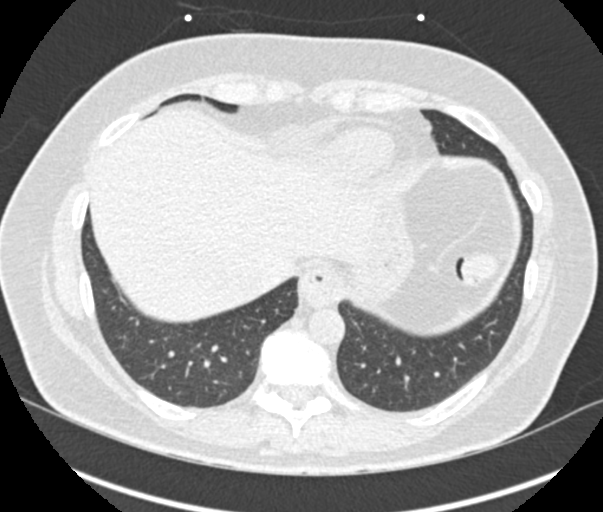
[im 24/70  vessel]
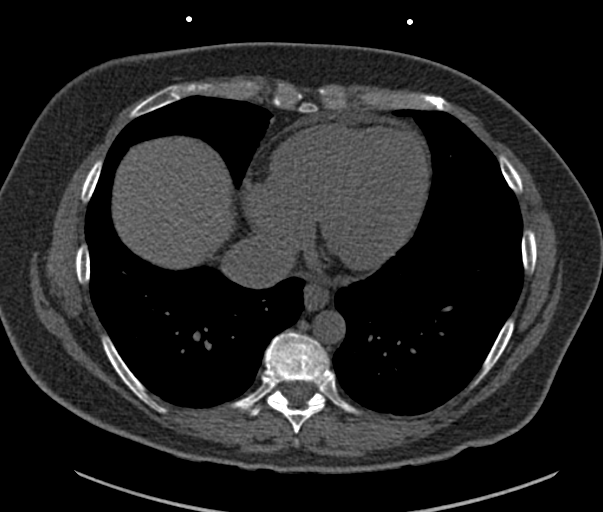
[im 35/70  vessel]
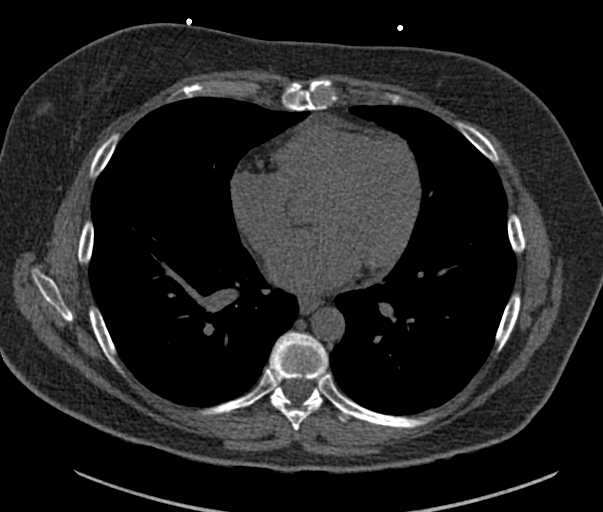
[im 47/70  vessel]
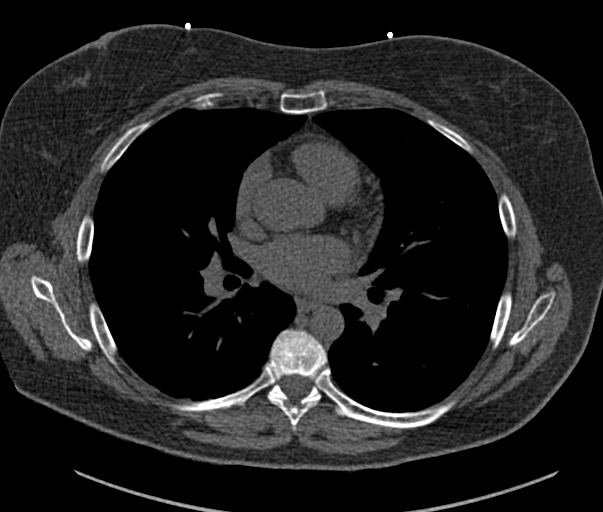
[im 58/70  vessel]
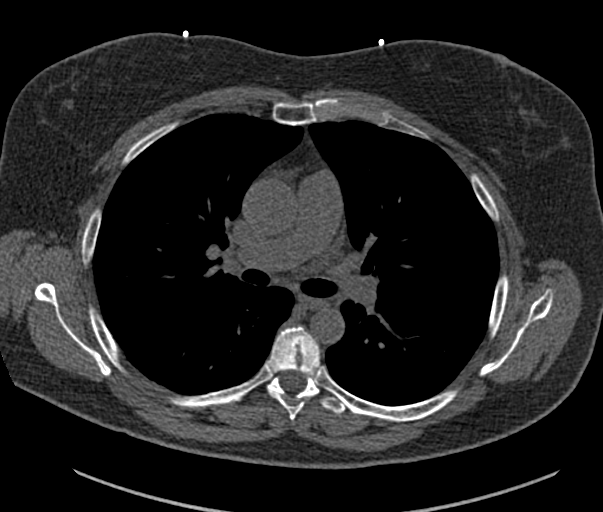
[im 58/70  lung]
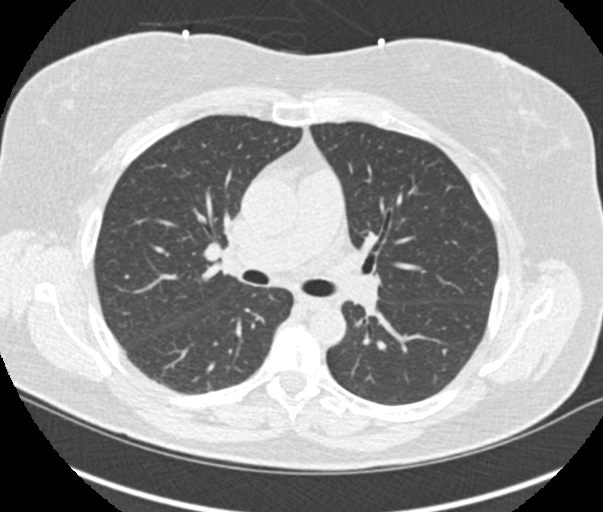

[Series 9: calcium scoring 2.00 br60 bestdiast 68% lungs · axial · 0.58mm/px · z∈[+1792,+1884]mm · 5 of 70 slices shown]
[im 12/70  vessel]
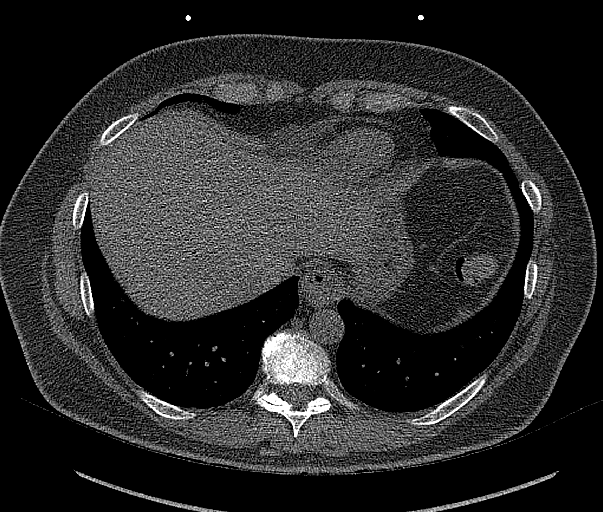
[im 24/70  vessel]
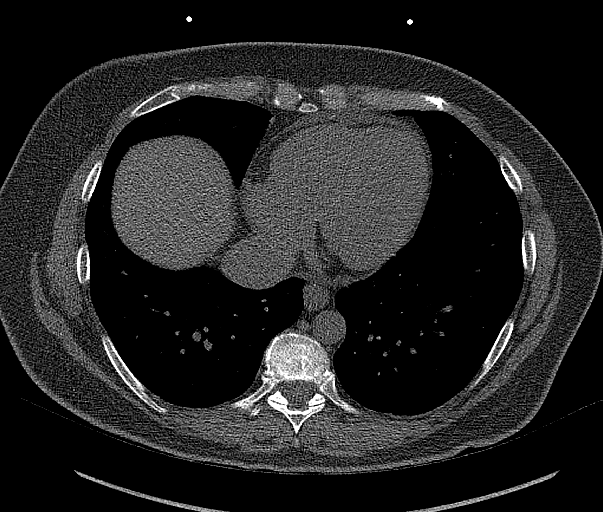
[im 35/70  vessel]
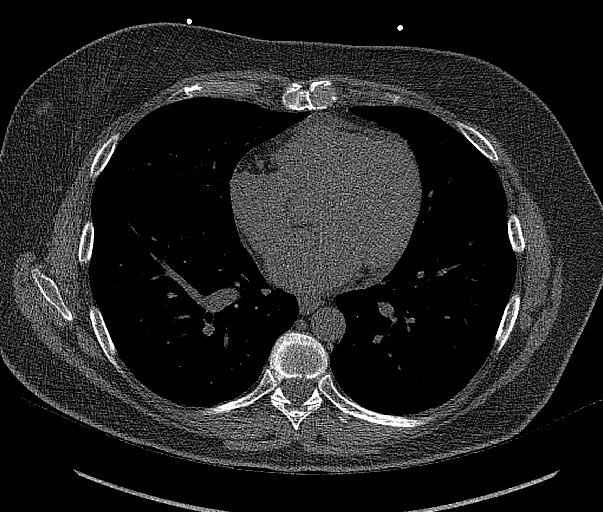
[im 47/70  vessel]
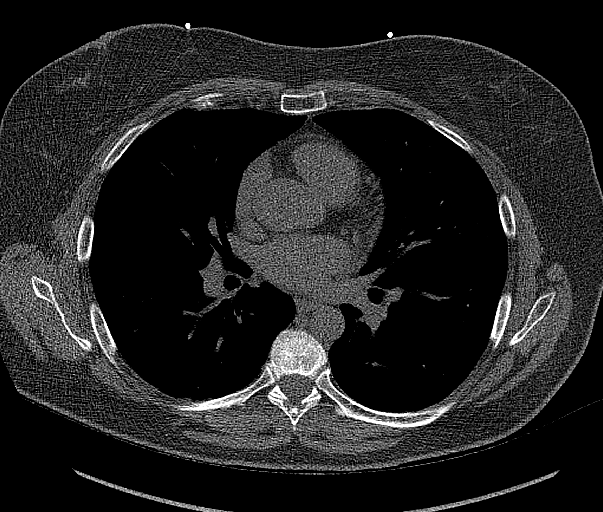
[im 58/70  vessel]
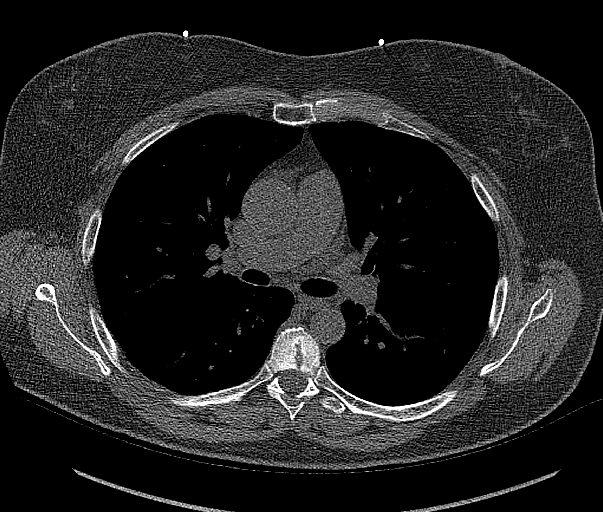

[14 of 20 positions shown; findings below may reference images not displayed]

FINDINGS: CORONARY CALCIUM SCORES:

Left Main: 0

LAD:

LCx:

RCA: 0

Total Agatston Score:

[HOSPITAL] percentile: 91

AORTA MEASUREMENTS:

Ascending Aorta: 32 mm

Descending Aorta: 19 mm

OTHER FINDINGS:

The heart size is within normal limits. No pericardial fluid is
identified. Visualized segments of the thoracic aorta and central
pulmonary arteries are normal in caliber. Visualized mediastinum and
hilar regions demonstrate no lymphadenopathy or masses. Probable
tiny hiatal hernia. Visualized lungs show no evidence of pulmonary
edema, consolidation, pneumothorax, nodule or pleural fluid.
Visualized upper abdomen and bony structures are unremarkable.
IMPRESSION: 1. Coronary calcium score of 97.7 is at the 91st percentile for the
patient's age, sex and race.
2. Probable small hiatal hernia.

## 2022-03-21 ENCOUNTER — Other Ambulatory Visit (HOSPITAL_COMMUNITY): Payer: Self-pay

## 2022-03-21 MED ORDER — VYVANSE 50 MG PO CAPS
50.0000 mg | ORAL_CAPSULE | Freq: Every morning | ORAL | 0 refills | Status: DC
Start: 2022-03-21 — End: 2022-04-17
  Filled 2022-03-21: qty 30, 30d supply, fill #0

## 2022-04-16 DIAGNOSIS — F909 Attention-deficit hyperactivity disorder, unspecified type: Secondary | ICD-10-CM | POA: Diagnosis not present

## 2022-04-16 DIAGNOSIS — E890 Postprocedural hypothyroidism: Secondary | ICD-10-CM | POA: Diagnosis not present

## 2022-04-16 DIAGNOSIS — E663 Overweight: Secondary | ICD-10-CM | POA: Diagnosis not present

## 2022-04-16 DIAGNOSIS — Z6826 Body mass index (BMI) 26.0-26.9, adult: Secondary | ICD-10-CM | POA: Diagnosis not present

## 2022-04-16 DIAGNOSIS — N83209 Unspecified ovarian cyst, unspecified side: Secondary | ICD-10-CM | POA: Diagnosis not present

## 2022-04-16 DIAGNOSIS — F5081 Binge eating disorder: Secondary | ICD-10-CM | POA: Diagnosis not present

## 2022-04-17 ENCOUNTER — Other Ambulatory Visit (HOSPITAL_COMMUNITY): Payer: Self-pay

## 2022-04-17 MED ORDER — LEVOTHYROXINE SODIUM 137 MCG PO TABS
137.0000 ug | ORAL_TABLET | Freq: Every morning | ORAL | 3 refills | Status: AC
Start: 2022-04-17 — End: ?
  Filled 2022-04-17: qty 90, 90d supply, fill #0
  Filled 2022-07-30: qty 90, 90d supply, fill #1

## 2022-04-17 MED ORDER — VYVANSE 50 MG PO CAPS
50.0000 mg | ORAL_CAPSULE | Freq: Every morning | ORAL | 0 refills | Status: DC
  Filled 2022-04-17 – 2022-04-18 (×2): qty 90, 90d supply, fill #0

## 2022-04-18 ENCOUNTER — Other Ambulatory Visit (HOSPITAL_COMMUNITY): Payer: Self-pay

## 2022-04-18 ENCOUNTER — Encounter (INDEPENDENT_AMBULATORY_CARE_PROVIDER_SITE_OTHER): Payer: Self-pay

## 2022-04-25 ENCOUNTER — Other Ambulatory Visit (HOSPITAL_COMMUNITY): Payer: Self-pay

## 2022-04-25 ENCOUNTER — Other Ambulatory Visit: Payer: Self-pay | Admitting: Family Medicine

## 2022-04-25 DIAGNOSIS — N83209 Unspecified ovarian cyst, unspecified side: Secondary | ICD-10-CM

## 2022-05-02 ENCOUNTER — Other Ambulatory Visit: Payer: Self-pay | Admitting: Family Medicine

## 2022-05-02 DIAGNOSIS — N83209 Unspecified ovarian cyst, unspecified side: Secondary | ICD-10-CM

## 2022-05-18 ENCOUNTER — Ambulatory Visit: Payer: 59

## 2022-05-18 MED ORDER — IOHEXOL 300 MG/ML  SOLN: 100.0000 mL | Freq: Once | INTRAMUSCULAR | Status: DC | PRN

## 2022-05-21 ENCOUNTER — Ambulatory Visit (INDEPENDENT_AMBULATORY_CARE_PROVIDER_SITE_OTHER): Payer: 59

## 2022-05-21 DIAGNOSIS — N83202 Unspecified ovarian cyst, left side: Secondary | ICD-10-CM | POA: Diagnosis not present

## 2022-05-21 MED ORDER — IOHEXOL 300 MG/ML  SOLN
100.0000 mL | Freq: Once | INTRAMUSCULAR | Status: AC | PRN
  Administered 2022-05-21: 100 mL via INTRAVENOUS

## 2022-06-18 DIAGNOSIS — Z72820 Sleep deprivation: Secondary | ICD-10-CM | POA: Diagnosis not present

## 2022-06-18 DIAGNOSIS — K5900 Constipation, unspecified: Secondary | ICD-10-CM | POA: Diagnosis not present

## 2022-06-18 DIAGNOSIS — Z8639 Personal history of other endocrine, nutritional and metabolic disease: Secondary | ICD-10-CM | POA: Diagnosis not present

## 2022-06-18 DIAGNOSIS — E039 Hypothyroidism, unspecified: Secondary | ICD-10-CM | POA: Diagnosis not present

## 2022-06-18 DIAGNOSIS — Z6828 Body mass index (BMI) 28.0-28.9, adult: Secondary | ICD-10-CM | POA: Diagnosis not present

## 2022-06-18 DIAGNOSIS — Z658 Other specified problems related to psychosocial circumstances: Secondary | ICD-10-CM | POA: Diagnosis not present

## 2022-06-20 ENCOUNTER — Other Ambulatory Visit (HOSPITAL_COMMUNITY): Payer: Self-pay

## 2022-06-20 MED ORDER — AZITHROMYCIN 250 MG PO TABS
ORAL_TABLET | ORAL | 0 refills | Status: AC
  Filled 2022-06-20: qty 6, 5d supply, fill #0

## 2022-07-23 ENCOUNTER — Other Ambulatory Visit (HOSPITAL_COMMUNITY): Payer: Self-pay

## 2022-07-23 DIAGNOSIS — F908 Attention-deficit hyperactivity disorder, other type: Secondary | ICD-10-CM | POA: Diagnosis not present

## 2022-07-23 DIAGNOSIS — Z6827 Body mass index (BMI) 27.0-27.9, adult: Secondary | ICD-10-CM | POA: Diagnosis not present

## 2022-07-23 DIAGNOSIS — E663 Overweight: Secondary | ICD-10-CM | POA: Diagnosis not present

## 2022-07-23 DIAGNOSIS — G4709 Other insomnia: Secondary | ICD-10-CM | POA: Diagnosis not present

## 2022-07-23 DIAGNOSIS — Z8639 Personal history of other endocrine, nutritional and metabolic disease: Secondary | ICD-10-CM | POA: Diagnosis not present

## 2022-07-23 DIAGNOSIS — E039 Hypothyroidism, unspecified: Secondary | ICD-10-CM | POA: Diagnosis not present

## 2022-07-23 MED ORDER — LISDEXAMFETAMINE DIMESYLATE 30 MG PO CAPS
60.0000 mg | ORAL_CAPSULE | Freq: Every day | ORAL | 0 refills | Status: DC
  Filled 2022-07-23 – 2022-07-26 (×3): qty 60, 30d supply, fill #0

## 2022-07-26 ENCOUNTER — Other Ambulatory Visit (HOSPITAL_COMMUNITY): Payer: Self-pay

## 2022-07-30 ENCOUNTER — Other Ambulatory Visit (HOSPITAL_COMMUNITY): Payer: Self-pay

## 2022-07-30 MED ORDER — LISDEXAMFETAMINE DIMESYLATE 40 MG PO CHEW
40.0000 mg | CHEWABLE_TABLET | Freq: Every day | ORAL | 0 refills | Status: DC
  Filled 2022-07-30: qty 30, 30d supply, fill #0

## 2022-07-30 MED ORDER — LISDEXAMFETAMINE DIMESYLATE 50 MG PO CAPS
50.0000 mg | ORAL_CAPSULE | Freq: Every morning | ORAL | 0 refills | Status: AC
  Filled 2022-07-30: qty 30, 30d supply, fill #0

## 2022-07-31 ENCOUNTER — Other Ambulatory Visit (HOSPITAL_COMMUNITY): Payer: Self-pay

## 2022-09-17 ENCOUNTER — Other Ambulatory Visit (HOSPITAL_COMMUNITY): Payer: Self-pay

## 2022-09-17 MED ORDER — LEVOTHYROXINE SODIUM 137 MCG PO TABS
137.0000 ug | ORAL_TABLET | Freq: Every morning | ORAL | 3 refills | Status: DC
  Filled 2022-09-17 – 2023-05-29 (×2): qty 90, 90d supply, fill #0

## 2022-09-17 MED ORDER — JORNAY PM 20 MG PO CP24
20.0000 mg | ORAL_CAPSULE | Freq: Every evening | ORAL | 0 refills | Status: AC
  Filled 2022-09-17: qty 30, 30d supply, fill #0

## 2022-09-19 ENCOUNTER — Other Ambulatory Visit (HOSPITAL_COMMUNITY): Payer: Self-pay

## 2022-10-19 DIAGNOSIS — Z20828 Contact with and (suspected) exposure to other viral communicable diseases: Secondary | ICD-10-CM | POA: Diagnosis not present

## 2022-10-19 DIAGNOSIS — Z20822 Contact with and (suspected) exposure to covid-19: Secondary | ICD-10-CM | POA: Diagnosis not present

## 2022-11-08 ENCOUNTER — Other Ambulatory Visit (HOSPITAL_COMMUNITY): Payer: Self-pay

## 2022-11-08 DIAGNOSIS — Z6827 Body mass index (BMI) 27.0-27.9, adult: Secondary | ICD-10-CM | POA: Diagnosis not present

## 2022-11-08 DIAGNOSIS — F908 Attention-deficit hyperactivity disorder, other type: Secondary | ICD-10-CM | POA: Diagnosis not present

## 2022-11-08 DIAGNOSIS — S060X0D Concussion without loss of consciousness, subsequent encounter: Secondary | ICD-10-CM | POA: Diagnosis not present

## 2022-11-08 DIAGNOSIS — Z8639 Personal history of other endocrine, nutritional and metabolic disease: Secondary | ICD-10-CM | POA: Diagnosis not present

## 2022-11-08 DIAGNOSIS — E663 Overweight: Secondary | ICD-10-CM | POA: Diagnosis not present

## 2022-11-08 DIAGNOSIS — R632 Polyphagia: Secondary | ICD-10-CM | POA: Diagnosis not present

## 2022-11-08 DIAGNOSIS — E890 Postprocedural hypothyroidism: Secondary | ICD-10-CM | POA: Diagnosis not present

## 2022-11-08 MED ORDER — LISDEXAMFETAMINE DIMESYLATE 40 MG PO CAPS: 40.0000 mg | ORAL_CAPSULE | Freq: Every morning | ORAL | 0 refills | Status: AC

## 2022-11-08 MED ORDER — LISDEXAMFETAMINE DIMESYLATE 40 MG PO CAPS
40.0000 mg | ORAL_CAPSULE | Freq: Every morning | ORAL | 0 refills | Status: AC
  Filled 2022-11-08: qty 30, 30d supply, fill #0

## 2022-11-09 ENCOUNTER — Other Ambulatory Visit (HOSPITAL_COMMUNITY): Payer: Self-pay

## 2022-11-12 ENCOUNTER — Other Ambulatory Visit (HOSPITAL_COMMUNITY): Payer: Self-pay

## 2022-11-12 MED ORDER — LEVOTHYROXINE SODIUM 137 MCG PO TABS
137.0000 ug | ORAL_TABLET | Freq: Every morning | ORAL | 3 refills | Status: DC
  Filled 2022-11-12: qty 90, 90d supply, fill #0

## 2022-11-12 MED ORDER — LISDEXAMFETAMINE DIMESYLATE 40 MG PO CHEW
40.0000 mg | CHEWABLE_TABLET | Freq: Every morning | ORAL | 0 refills | Status: DC
  Filled 2022-11-12: qty 30, 30d supply, fill #0

## 2022-11-13 ENCOUNTER — Other Ambulatory Visit (HOSPITAL_COMMUNITY): Payer: Self-pay

## 2022-11-14 ENCOUNTER — Other Ambulatory Visit (HOSPITAL_COMMUNITY): Payer: Self-pay

## 2022-11-16 DIAGNOSIS — Z1231 Encounter for screening mammogram for malignant neoplasm of breast: Secondary | ICD-10-CM | POA: Diagnosis not present

## 2022-11-16 LAB — HM MAMMOGRAPHY

## 2022-11-21 ENCOUNTER — Other Ambulatory Visit (HOSPITAL_COMMUNITY): Payer: Self-pay

## 2022-11-22 ENCOUNTER — Other Ambulatory Visit (HOSPITAL_COMMUNITY): Payer: Self-pay

## 2022-11-23 ENCOUNTER — Other Ambulatory Visit (HOSPITAL_COMMUNITY): Payer: Self-pay

## 2022-12-06 ENCOUNTER — Other Ambulatory Visit (HOSPITAL_COMMUNITY): Payer: Self-pay

## 2022-12-06 DIAGNOSIS — F908 Attention-deficit hyperactivity disorder, other type: Secondary | ICD-10-CM | POA: Diagnosis not present

## 2022-12-06 DIAGNOSIS — E559 Vitamin D deficiency, unspecified: Secondary | ICD-10-CM | POA: Diagnosis not present

## 2022-12-06 DIAGNOSIS — Z8639 Personal history of other endocrine, nutritional and metabolic disease: Secondary | ICD-10-CM | POA: Diagnosis not present

## 2022-12-06 DIAGNOSIS — D351 Benign neoplasm of parathyroid gland: Secondary | ICD-10-CM | POA: Diagnosis not present

## 2022-12-06 DIAGNOSIS — R7303 Prediabetes: Secondary | ICD-10-CM | POA: Diagnosis not present

## 2022-12-06 DIAGNOSIS — Z13220 Encounter for screening for lipoid disorders: Secondary | ICD-10-CM | POA: Diagnosis not present

## 2022-12-06 DIAGNOSIS — K219 Gastro-esophageal reflux disease without esophagitis: Secondary | ICD-10-CM | POA: Diagnosis not present

## 2022-12-06 DIAGNOSIS — M8580 Other specified disorders of bone density and structure, unspecified site: Secondary | ICD-10-CM | POA: Diagnosis not present

## 2022-12-06 DIAGNOSIS — J069 Acute upper respiratory infection, unspecified: Secondary | ICD-10-CM | POA: Diagnosis not present

## 2022-12-06 DIAGNOSIS — R7301 Impaired fasting glucose: Secondary | ICD-10-CM | POA: Diagnosis not present

## 2022-12-06 DIAGNOSIS — E890 Postprocedural hypothyroidism: Secondary | ICD-10-CM | POA: Diagnosis not present

## 2022-12-06 DIAGNOSIS — R632 Polyphagia: Secondary | ICD-10-CM | POA: Diagnosis not present

## 2022-12-06 MED ORDER — LISDEXAMFETAMINE DIMESYLATE 40 MG PO CHEW
40.0000 mg | CHEWABLE_TABLET | Freq: Every morning | ORAL | 0 refills | Status: DC
  Filled 2022-12-13: qty 30, 30d supply, fill #0

## 2022-12-06 MED ORDER — ALBUTEROL SULFATE HFA 108 (90 BASE) MCG/ACT IN AERS
1.0000 | INHALATION_SPRAY | RESPIRATORY_TRACT | 3 refills | Status: AC
  Filled 2022-12-06: qty 6.7, 34d supply, fill #0
  Filled 2023-05-29: qty 6.7, 34d supply, fill #1

## 2022-12-06 MED ORDER — PREDNISONE 5 MG PO TABS
ORAL_TABLET | ORAL | 0 refills | Status: AC
  Filled 2022-12-06: qty 21, 6d supply, fill #0

## 2022-12-11 ENCOUNTER — Other Ambulatory Visit (HOSPITAL_COMMUNITY): Payer: Self-pay

## 2022-12-11 MED ORDER — LEVOTHYROXINE SODIUM 125 MCG PO TABS
125.0000 ug | ORAL_TABLET | Freq: Every day | ORAL | 0 refills | Status: DC
  Filled 2022-12-11: qty 90, 90d supply, fill #0

## 2022-12-11 MED ORDER — LEVOTHYROXINE SODIUM 125 MCG PO CAPS
ORAL_CAPSULE | ORAL | 1 refills | Status: DC
  Filled 2022-12-11: qty 90, 90d supply, fill #0

## 2022-12-13 ENCOUNTER — Other Ambulatory Visit (HOSPITAL_COMMUNITY): Payer: Self-pay

## 2023-01-04 ENCOUNTER — Other Ambulatory Visit (HOSPITAL_COMMUNITY): Payer: Self-pay

## 2023-01-07 ENCOUNTER — Other Ambulatory Visit (HOSPITAL_COMMUNITY): Payer: Self-pay

## 2023-01-07 MED ORDER — LISDEXAMFETAMINE DIMESYLATE 40 MG PO CHEW
40.0000 mg | CHEWABLE_TABLET | Freq: Every morning | ORAL | 0 refills | Status: AC
  Filled 2023-01-07: qty 30, 30d supply, fill #0

## 2023-01-15 ENCOUNTER — Other Ambulatory Visit (HOSPITAL_COMMUNITY): Payer: Self-pay

## 2023-01-15 MED ORDER — LISDEXAMFETAMINE DIMESYLATE 40 MG PO CHEW
40.0000 mg | CHEWABLE_TABLET | Freq: Every morning | ORAL | 0 refills | Status: AC
  Filled 2023-01-15: qty 30, 30d supply, fill #0

## 2023-01-21 ENCOUNTER — Other Ambulatory Visit (HOSPITAL_COMMUNITY): Payer: Self-pay

## 2023-01-21 DIAGNOSIS — E663 Overweight: Secondary | ICD-10-CM | POA: Diagnosis not present

## 2023-01-21 DIAGNOSIS — R948 Abnormal results of function studies of other organs and systems: Secondary | ICD-10-CM | POA: Diagnosis not present

## 2023-01-21 DIAGNOSIS — M8580 Other specified disorders of bone density and structure, unspecified site: Secondary | ICD-10-CM | POA: Diagnosis not present

## 2023-01-21 DIAGNOSIS — E559 Vitamin D deficiency, unspecified: Secondary | ICD-10-CM | POA: Diagnosis not present

## 2023-01-21 DIAGNOSIS — E890 Postprocedural hypothyroidism: Secondary | ICD-10-CM | POA: Diagnosis not present

## 2023-01-21 DIAGNOSIS — R7301 Impaired fasting glucose: Secondary | ICD-10-CM | POA: Diagnosis not present

## 2023-01-21 DIAGNOSIS — Z8639 Personal history of other endocrine, nutritional and metabolic disease: Secondary | ICD-10-CM | POA: Diagnosis not present

## 2023-01-21 DIAGNOSIS — G43909 Migraine, unspecified, not intractable, without status migrainosus: Secondary | ICD-10-CM | POA: Diagnosis not present

## 2023-01-21 DIAGNOSIS — F908 Attention-deficit hyperactivity disorder, other type: Secondary | ICD-10-CM | POA: Diagnosis not present

## 2023-01-21 DIAGNOSIS — Z6828 Body mass index (BMI) 28.0-28.9, adult: Secondary | ICD-10-CM | POA: Diagnosis not present

## 2023-01-21 MED ORDER — QULIPTA 60 MG PO TABS
60.0000 mg | ORAL_TABLET | Freq: Every day | ORAL | 1 refills | Status: DC
  Filled 2023-01-21 – 2023-03-01 (×2): qty 30, 30d supply, fill #0
  Filled 2023-03-24: qty 30, 30d supply, fill #1

## 2023-01-31 ENCOUNTER — Other Ambulatory Visit (HOSPITAL_COMMUNITY): Payer: Self-pay

## 2023-01-31 MED ORDER — THYROID 30 MG PO TABS
30.0000 mg | ORAL_TABLET | Freq: Every day | ORAL | 5 refills | Status: DC
  Filled 2023-01-31: qty 30, 30d supply, fill #0

## 2023-02-01 ENCOUNTER — Other Ambulatory Visit (HOSPITAL_COMMUNITY): Payer: Self-pay

## 2023-02-14 ENCOUNTER — Other Ambulatory Visit (HOSPITAL_COMMUNITY): Payer: Self-pay

## 2023-02-14 MED ORDER — LISDEXAMFETAMINE DIMESYLATE 40 MG PO CHEW
40.0000 mg | CHEWABLE_TABLET | Freq: Every morning | ORAL | 0 refills | Status: DC
  Filled 2023-02-14: qty 30, 30d supply, fill #0

## 2023-02-27 ENCOUNTER — Other Ambulatory Visit (HOSPITAL_COMMUNITY): Payer: Self-pay

## 2023-02-27 MED ORDER — THYROID 30 MG PO TABS
45.0000 mg | ORAL_TABLET | Freq: Every day | ORAL | 0 refills | Status: DC
  Filled 2023-02-27: qty 45, 30d supply, fill #0

## 2023-02-28 ENCOUNTER — Other Ambulatory Visit (HOSPITAL_COMMUNITY): Payer: Self-pay

## 2023-03-01 ENCOUNTER — Other Ambulatory Visit (HOSPITAL_COMMUNITY): Payer: Self-pay

## 2023-03-24 ENCOUNTER — Other Ambulatory Visit (HOSPITAL_COMMUNITY): Payer: Self-pay

## 2023-03-25 ENCOUNTER — Other Ambulatory Visit: Payer: Self-pay

## 2023-03-25 ENCOUNTER — Other Ambulatory Visit (HOSPITAL_COMMUNITY): Payer: Self-pay

## 2023-03-25 DIAGNOSIS — F439 Reaction to severe stress, unspecified: Secondary | ICD-10-CM | POA: Diagnosis not present

## 2023-03-25 DIAGNOSIS — R632 Polyphagia: Secondary | ICD-10-CM | POA: Diagnosis not present

## 2023-03-25 DIAGNOSIS — E663 Overweight: Secondary | ICD-10-CM | POA: Diagnosis not present

## 2023-03-25 DIAGNOSIS — E039 Hypothyroidism, unspecified: Secondary | ICD-10-CM | POA: Diagnosis not present

## 2023-03-25 DIAGNOSIS — Z6828 Body mass index (BMI) 28.0-28.9, adult: Secondary | ICD-10-CM | POA: Diagnosis not present

## 2023-03-25 DIAGNOSIS — Z8639 Personal history of other endocrine, nutritional and metabolic disease: Secondary | ICD-10-CM | POA: Diagnosis not present

## 2023-03-25 MED ORDER — LISDEXAMFETAMINE DIMESYLATE 40 MG PO CHEW
40.0000 mg | CHEWABLE_TABLET | Freq: Every morning | ORAL | 0 refills | Status: AC
  Filled 2023-03-25: qty 30, 30d supply, fill #0

## 2023-03-25 MED ORDER — THYROID 30 MG PO TABS
45.0000 mg | ORAL_TABLET | Freq: Every morning | ORAL | 1 refills | Status: DC
  Filled 2023-03-25: qty 45, 30d supply, fill #0
  Filled 2023-05-29: qty 45, 30d supply, fill #1

## 2023-03-26 ENCOUNTER — Other Ambulatory Visit (HOSPITAL_COMMUNITY): Payer: Self-pay

## 2023-03-26 ENCOUNTER — Other Ambulatory Visit: Payer: Self-pay

## 2023-03-26 MED ORDER — THYROID 30 MG PO TABS
ORAL_TABLET | ORAL | 0 refills | Status: DC
  Filled 2023-03-26 – 2023-05-29 (×2): qty 76, 30d supply, fill #0

## 2023-03-28 ENCOUNTER — Other Ambulatory Visit (HOSPITAL_COMMUNITY): Payer: Self-pay

## 2023-03-29 ENCOUNTER — Other Ambulatory Visit (HOSPITAL_COMMUNITY): Payer: Self-pay

## 2023-04-03 ENCOUNTER — Other Ambulatory Visit (HOSPITAL_COMMUNITY): Payer: Self-pay

## 2023-04-04 ENCOUNTER — Other Ambulatory Visit (HOSPITAL_COMMUNITY): Payer: Self-pay

## 2023-04-04 MED ORDER — LISDEXAMFETAMINE DIMESYLATE 40 MG PO CAPS
40.0000 mg | ORAL_CAPSULE | Freq: Every day | ORAL | 0 refills | Status: AC
  Filled 2023-04-04: qty 30, 30d supply, fill #0

## 2023-04-04 MED ORDER — THYROID 30 MG PO TABS
90.0000 mg | ORAL_TABLET | Freq: Every day | ORAL | 1 refills | Status: DC
  Filled 2023-04-04: qty 90, 30d supply, fill #0
  Filled 2023-04-19 – 2023-04-30 (×2): qty 90, 30d supply, fill #1

## 2023-04-10 ENCOUNTER — Other Ambulatory Visit (HOSPITAL_COMMUNITY): Payer: Self-pay

## 2023-04-18 ENCOUNTER — Other Ambulatory Visit (HOSPITAL_COMMUNITY): Payer: Self-pay

## 2023-04-19 ENCOUNTER — Other Ambulatory Visit (HOSPITAL_COMMUNITY): Payer: Self-pay

## 2023-05-09 ENCOUNTER — Other Ambulatory Visit (HOSPITAL_COMMUNITY): Payer: Self-pay

## 2023-05-09 DIAGNOSIS — Z8639 Personal history of other endocrine, nutritional and metabolic disease: Secondary | ICD-10-CM | POA: Diagnosis not present

## 2023-05-09 DIAGNOSIS — E663 Overweight: Secondary | ICD-10-CM | POA: Diagnosis not present

## 2023-05-09 DIAGNOSIS — Z6828 Body mass index (BMI) 28.0-28.9, adult: Secondary | ICD-10-CM | POA: Diagnosis not present

## 2023-05-09 DIAGNOSIS — G43909 Migraine, unspecified, not intractable, without status migrainosus: Secondary | ICD-10-CM | POA: Diagnosis not present

## 2023-05-09 DIAGNOSIS — E890 Postprocedural hypothyroidism: Secondary | ICD-10-CM | POA: Diagnosis not present

## 2023-05-09 DIAGNOSIS — F908 Attention-deficit hyperactivity disorder, other type: Secondary | ICD-10-CM | POA: Diagnosis not present

## 2023-05-09 MED ORDER — LISDEXAMFETAMINE DIMESYLATE 60 MG PO CHEW
60.0000 mg | CHEWABLE_TABLET | Freq: Every morning | ORAL | 0 refills | Status: DC
  Filled 2023-05-09 – 2023-05-29 (×2): qty 30, 30d supply, fill #0

## 2023-05-29 ENCOUNTER — Other Ambulatory Visit: Payer: Self-pay

## 2023-05-29 ENCOUNTER — Other Ambulatory Visit (HOSPITAL_COMMUNITY): Payer: Self-pay

## 2023-05-30 ENCOUNTER — Other Ambulatory Visit (HOSPITAL_COMMUNITY): Payer: Self-pay

## 2023-05-30 MED ORDER — THYROID 30 MG PO TABS
90.0000 mg | ORAL_TABLET | Freq: Every day | ORAL | 1 refills | Status: DC
  Filled 2023-05-30: qty 90, 30d supply, fill #0
  Filled 2023-06-28: qty 90, 30d supply, fill #1

## 2023-05-31 ENCOUNTER — Other Ambulatory Visit (HOSPITAL_COMMUNITY): Payer: Self-pay

## 2023-06-28 ENCOUNTER — Other Ambulatory Visit (HOSPITAL_COMMUNITY): Payer: Self-pay

## 2023-07-01 ENCOUNTER — Other Ambulatory Visit (HOSPITAL_COMMUNITY): Payer: Self-pay

## 2023-07-01 MED ORDER — LISDEXAMFETAMINE DIMESYLATE 60 MG PO CHEW
60.0000 mg | CHEWABLE_TABLET | Freq: Every morning | ORAL | 0 refills | Status: DC
Start: 2023-07-01 — End: 2023-07-30
  Filled 2023-07-01: qty 30, 30d supply, fill #0

## 2023-07-28 ENCOUNTER — Other Ambulatory Visit (HOSPITAL_COMMUNITY): Payer: Self-pay

## 2023-07-29 ENCOUNTER — Other Ambulatory Visit (HOSPITAL_COMMUNITY): Payer: Self-pay

## 2023-07-29 MED ORDER — THYROID 30 MG PO TABS
90.0000 mg | ORAL_TABLET | Freq: Every day | ORAL | 1 refills | Status: DC
  Filled 2023-07-29: qty 90, 30d supply, fill #0
  Filled 2023-08-30: qty 90, 30d supply, fill #1

## 2023-07-30 ENCOUNTER — Other Ambulatory Visit (HOSPITAL_COMMUNITY): Payer: Self-pay

## 2023-07-30 MED ORDER — LISDEXAMFETAMINE DIMESYLATE 60 MG PO CHEW
60.0000 mg | CHEWABLE_TABLET | Freq: Every morning | ORAL | 0 refills | Status: DC
Start: 2023-07-30 — End: 2023-08-26
  Filled 2023-07-30: qty 30, 30d supply, fill #0

## 2023-08-21 ENCOUNTER — Other Ambulatory Visit (HOSPITAL_COMMUNITY): Payer: Self-pay

## 2023-08-22 ENCOUNTER — Other Ambulatory Visit (HOSPITAL_COMMUNITY): Payer: Self-pay

## 2023-08-22 ENCOUNTER — Encounter (HOSPITAL_COMMUNITY): Payer: Self-pay

## 2023-08-26 ENCOUNTER — Other Ambulatory Visit (HOSPITAL_COMMUNITY): Payer: Self-pay

## 2023-08-26 MED ORDER — LISDEXAMFETAMINE DIMESYLATE 60 MG PO CHEW
60.0000 mg | CHEWABLE_TABLET | Freq: Every morning | ORAL | 0 refills | Status: DC
Start: 2023-08-25 — End: 2023-09-27
  Filled 2023-08-26 – 2023-08-30 (×2): qty 30, 30d supply, fill #0

## 2023-08-30 ENCOUNTER — Other Ambulatory Visit (HOSPITAL_COMMUNITY): Payer: Self-pay

## 2023-08-30 ENCOUNTER — Other Ambulatory Visit: Payer: Self-pay

## 2023-09-27 ENCOUNTER — Other Ambulatory Visit (HOSPITAL_COMMUNITY): Payer: Self-pay

## 2023-09-27 MED ORDER — LISDEXAMFETAMINE DIMESYLATE 60 MG PO CHEW
60.0000 mg | CHEWABLE_TABLET | Freq: Every morning | ORAL | 0 refills | Status: AC
Start: 2023-09-27 — End: ?
  Filled 2023-09-30: qty 30, 30d supply, fill #0

## 2023-09-30 ENCOUNTER — Other Ambulatory Visit (HOSPITAL_COMMUNITY): Payer: Self-pay

## 2023-09-30 ENCOUNTER — Other Ambulatory Visit: Payer: Self-pay

## 2023-10-03 ENCOUNTER — Other Ambulatory Visit (HOSPITAL_COMMUNITY): Payer: Self-pay

## 2023-10-03 MED ORDER — THYROID 30 MG PO TABS
90.0000 mg | ORAL_TABLET | Freq: Every day | ORAL | 1 refills | Status: DC
  Filled 2023-10-03: qty 90, 30d supply, fill #0
  Filled 2023-11-03: qty 90, 30d supply, fill #1

## 2023-10-21 ENCOUNTER — Other Ambulatory Visit (HOSPITAL_COMMUNITY): Payer: Self-pay

## 2023-10-21 DIAGNOSIS — E663 Overweight: Secondary | ICD-10-CM | POA: Diagnosis not present

## 2023-10-21 DIAGNOSIS — Z6829 Body mass index (BMI) 29.0-29.9, adult: Secondary | ICD-10-CM | POA: Diagnosis not present

## 2023-10-21 DIAGNOSIS — Z8639 Personal history of other endocrine, nutritional and metabolic disease: Secondary | ICD-10-CM | POA: Diagnosis not present

## 2023-10-21 DIAGNOSIS — E039 Hypothyroidism, unspecified: Secondary | ICD-10-CM | POA: Diagnosis not present

## 2023-10-21 DIAGNOSIS — Z79899 Other long term (current) drug therapy: Secondary | ICD-10-CM | POA: Diagnosis not present

## 2023-10-21 DIAGNOSIS — F908 Attention-deficit hyperactivity disorder, other type: Secondary | ICD-10-CM | POA: Diagnosis not present

## 2023-10-21 MED ORDER — THYROID 90 MG PO TABS
90.0000 mg | ORAL_TABLET | Freq: Every day | ORAL | 3 refills | Status: AC
  Filled 2023-10-21: qty 90, 90d supply, fill #0

## 2023-10-21 MED ORDER — LISDEXAMFETAMINE DIMESYLATE 60 MG PO CHEW
60.0000 mg | CHEWABLE_TABLET | Freq: Every morning | ORAL | 0 refills | Status: DC
  Filled 2023-11-04: qty 30, 30d supply, fill #0

## 2023-10-22 ENCOUNTER — Other Ambulatory Visit (HOSPITAL_COMMUNITY): Payer: Self-pay

## 2023-10-25 ENCOUNTER — Other Ambulatory Visit (HOSPITAL_COMMUNITY): Payer: Self-pay

## 2023-11-04 ENCOUNTER — Other Ambulatory Visit (HOSPITAL_COMMUNITY): Payer: Self-pay

## 2023-12-02 ENCOUNTER — Other Ambulatory Visit (HOSPITAL_COMMUNITY): Payer: Self-pay

## 2023-12-03 ENCOUNTER — Other Ambulatory Visit (HOSPITAL_COMMUNITY): Payer: Self-pay

## 2023-12-03 MED ORDER — LISDEXAMFETAMINE DIMESYLATE 60 MG PO CHEW
60.0000 mg | CHEWABLE_TABLET | Freq: Every morning | ORAL | 0 refills | Status: DC
  Filled 2023-12-03: qty 30, 30d supply, fill #0

## 2023-12-03 MED ORDER — THYROID 30 MG PO TABS
90.0000 mg | ORAL_TABLET | Freq: Every day | ORAL | 1 refills | Status: DC
  Filled 2023-12-03: qty 90, 30d supply, fill #0
  Filled 2024-01-09: qty 90, 30d supply, fill #1

## 2024-01-09 ENCOUNTER — Other Ambulatory Visit (HOSPITAL_COMMUNITY): Payer: Self-pay

## 2024-01-09 ENCOUNTER — Other Ambulatory Visit: Payer: Self-pay

## 2024-01-09 DIAGNOSIS — R632 Polyphagia: Secondary | ICD-10-CM | POA: Diagnosis not present

## 2024-01-09 DIAGNOSIS — F908 Attention-deficit hyperactivity disorder, other type: Secondary | ICD-10-CM | POA: Diagnosis not present

## 2024-01-09 DIAGNOSIS — Z8639 Personal history of other endocrine, nutritional and metabolic disease: Secondary | ICD-10-CM | POA: Diagnosis not present

## 2024-01-09 DIAGNOSIS — Z6829 Body mass index (BMI) 29.0-29.9, adult: Secondary | ICD-10-CM | POA: Diagnosis not present

## 2024-01-09 DIAGNOSIS — E663 Overweight: Secondary | ICD-10-CM | POA: Diagnosis not present

## 2024-01-09 DIAGNOSIS — E039 Hypothyroidism, unspecified: Secondary | ICD-10-CM | POA: Diagnosis not present

## 2024-01-09 MED ORDER — LISDEXAMFETAMINE DIMESYLATE 60 MG PO CHEW
60.0000 mg | CHEWABLE_TABLET | Freq: Every morning | ORAL | 0 refills | Status: DC
  Filled 2024-01-09: qty 30, 30d supply, fill #0

## 2024-01-09 MED ORDER — METHYLPHENIDATE HCL 5 MG PO TABS
5.0000 mg | ORAL_TABLET | Freq: Every day | ORAL | 0 refills | Status: DC
  Filled 2024-01-09: qty 60, 30d supply, fill #0

## 2024-02-10 ENCOUNTER — Other Ambulatory Visit (HOSPITAL_COMMUNITY): Payer: Self-pay

## 2024-02-10 MED ORDER — METHYLPHENIDATE HCL 5 MG PO TABS
5.0000 mg | ORAL_TABLET | Freq: Every day | ORAL | 0 refills | Status: DC
  Filled 2024-02-10: qty 60, 30d supply, fill #0

## 2024-02-10 MED ORDER — THYROID 30 MG PO TABS
90.0000 mg | ORAL_TABLET | Freq: Every day | ORAL | 1 refills | Status: AC
  Filled 2024-02-10: qty 90, 30d supply, fill #0
  Filled 2024-03-12 (×2): qty 90, 30d supply, fill #1

## 2024-02-10 MED ORDER — LISDEXAMFETAMINE DIMESYLATE 60 MG PO CHEW
60.0000 mg | CHEWABLE_TABLET | ORAL | 0 refills | Status: DC
  Filled 2024-02-10: qty 30, 30d supply, fill #0

## 2024-02-28 DIAGNOSIS — R600 Localized edema: Secondary | ICD-10-CM | POA: Diagnosis not present

## 2024-02-28 DIAGNOSIS — K5903 Drug induced constipation: Secondary | ICD-10-CM | POA: Diagnosis not present

## 2024-02-28 DIAGNOSIS — F908 Attention-deficit hyperactivity disorder, other type: Secondary | ICD-10-CM | POA: Diagnosis not present

## 2024-02-28 DIAGNOSIS — Z6830 Body mass index (BMI) 30.0-30.9, adult: Secondary | ICD-10-CM | POA: Diagnosis not present

## 2024-02-28 DIAGNOSIS — E039 Hypothyroidism, unspecified: Secondary | ICD-10-CM | POA: Diagnosis not present

## 2024-02-28 DIAGNOSIS — E66811 Obesity, class 1: Secondary | ICD-10-CM | POA: Diagnosis not present

## 2024-03-09 ENCOUNTER — Other Ambulatory Visit (HOSPITAL_COMMUNITY): Payer: Self-pay

## 2024-03-09 MED ORDER — METHYLPHENIDATE HCL 5 MG PO TABS
5.0000 mg | ORAL_TABLET | Freq: Every day | ORAL | 0 refills | Status: DC
  Filled 2024-03-09: qty 60, 30d supply, fill #0

## 2024-03-09 MED ORDER — LISDEXAMFETAMINE DIMESYLATE 60 MG PO CHEW
60.0000 mg | CHEWABLE_TABLET | Freq: Every morning | ORAL | 0 refills | Status: DC
  Filled 2024-03-09: qty 30, 30d supply, fill #0

## 2024-03-12 ENCOUNTER — Other Ambulatory Visit (HOSPITAL_COMMUNITY): Payer: Self-pay

## 2024-03-16 ENCOUNTER — Other Ambulatory Visit: Payer: Self-pay

## 2024-03-16 ENCOUNTER — Other Ambulatory Visit (HOSPITAL_COMMUNITY): Payer: Self-pay

## 2024-03-16 MED ORDER — THYROID 90 MG PO TABS
90.0000 mg | ORAL_TABLET | Freq: Every day | ORAL | 0 refills | Status: AC
  Filled 2024-03-16: qty 90, 90d supply, fill #0

## 2024-03-16 MED ORDER — THYROID 15 MG PO TABS
15.0000 mg | ORAL_TABLET | Freq: Every day | ORAL | 0 refills | Status: DC
  Filled 2024-03-16: qty 90, 90d supply, fill #0

## 2024-03-20 DIAGNOSIS — Z1231 Encounter for screening mammogram for malignant neoplasm of breast: Secondary | ICD-10-CM | POA: Diagnosis not present

## 2024-03-20 DIAGNOSIS — R92323 Mammographic fibroglandular density, bilateral breasts: Secondary | ICD-10-CM | POA: Diagnosis not present

## 2024-04-29 ENCOUNTER — Other Ambulatory Visit (HOSPITAL_COMMUNITY): Payer: Self-pay

## 2024-04-29 MED ORDER — THYROID 15 MG PO TABS
15.0000 mg | ORAL_TABLET | Freq: Every day | ORAL | 3 refills | Status: AC
  Filled 2024-04-29: qty 90, 90d supply, fill #0

## 2024-04-29 MED ORDER — METHYLPHENIDATE HCL 5 MG PO TABS
5.0000 mg | ORAL_TABLET | Freq: Every day | ORAL | 0 refills | Status: AC
  Filled 2024-04-29: qty 60, 30d supply, fill #0

## 2024-04-29 MED ORDER — THYROID 90 MG PO TABS
90.0000 mg | ORAL_TABLET | Freq: Every day | ORAL | 3 refills | Status: AC
  Filled 2024-04-29: qty 90, 90d supply, fill #0

## 2024-04-29 MED ORDER — LISDEXAMFETAMINE DIMESYLATE 60 MG PO CHEW
60.0000 mg | CHEWABLE_TABLET | Freq: Every morning | ORAL | 0 refills | Status: DC
  Filled 2024-04-29: qty 30, 30d supply, fill #0

## 2024-04-30 ENCOUNTER — Other Ambulatory Visit (HOSPITAL_COMMUNITY): Payer: Self-pay

## 2024-06-08 ENCOUNTER — Other Ambulatory Visit (HOSPITAL_COMMUNITY): Payer: Self-pay

## 2024-06-08 DIAGNOSIS — E039 Hypothyroidism, unspecified: Secondary | ICD-10-CM | POA: Diagnosis not present

## 2024-06-08 DIAGNOSIS — E66811 Obesity, class 1: Secondary | ICD-10-CM | POA: Diagnosis not present

## 2024-06-08 DIAGNOSIS — R635 Abnormal weight gain: Secondary | ICD-10-CM | POA: Diagnosis not present

## 2024-06-08 DIAGNOSIS — F908 Attention-deficit hyperactivity disorder, other type: Secondary | ICD-10-CM | POA: Diagnosis not present

## 2024-06-08 DIAGNOSIS — Z6831 Body mass index (BMI) 31.0-31.9, adult: Secondary | ICD-10-CM | POA: Diagnosis not present

## 2024-06-08 MED ORDER — LEVOTHYROXINE SODIUM 125 MCG PO TABS
125.0000 ug | ORAL_TABLET | Freq: Every morning | ORAL | 2 refills | Status: DC
  Filled 2024-06-08: qty 30, 30d supply, fill #0
  Filled 2024-07-20: qty 30, 30d supply, fill #1

## 2024-06-08 MED ORDER — LISDEXAMFETAMINE DIMESYLATE 60 MG PO CHEW
60.0000 mg | CHEWABLE_TABLET | Freq: Every morning | ORAL | 0 refills | Status: DC
  Filled 2024-06-08: qty 30, 30d supply, fill #0

## 2024-07-20 ENCOUNTER — Other Ambulatory Visit (HOSPITAL_COMMUNITY): Payer: Self-pay

## 2024-07-21 ENCOUNTER — Other Ambulatory Visit: Payer: Self-pay

## 2024-07-21 ENCOUNTER — Other Ambulatory Visit (HOSPITAL_COMMUNITY): Payer: Self-pay

## 2024-07-21 MED ORDER — LISDEXAMFETAMINE DIMESYLATE 60 MG PO CHEW
60.0000 mg | CHEWABLE_TABLET | Freq: Every morning | ORAL | 0 refills | Status: DC
  Filled 2024-07-21: qty 30, 30d supply, fill #0

## 2024-07-24 ENCOUNTER — Other Ambulatory Visit (HOSPITAL_COMMUNITY): Payer: Self-pay

## 2024-08-21 DIAGNOSIS — Z13220 Encounter for screening for lipoid disorders: Secondary | ICD-10-CM | POA: Diagnosis not present

## 2024-08-21 DIAGNOSIS — Z23 Encounter for immunization: Secondary | ICD-10-CM | POA: Diagnosis not present

## 2024-08-21 DIAGNOSIS — R0683 Snoring: Secondary | ICD-10-CM | POA: Diagnosis not present

## 2024-08-21 DIAGNOSIS — Z0000 Encounter for general adult medical examination without abnormal findings: Secondary | ICD-10-CM | POA: Diagnosis not present

## 2024-08-21 DIAGNOSIS — Z131 Encounter for screening for diabetes mellitus: Secondary | ICD-10-CM | POA: Diagnosis not present

## 2024-08-21 DIAGNOSIS — E669 Obesity, unspecified: Secondary | ICD-10-CM | POA: Diagnosis not present

## 2024-08-21 DIAGNOSIS — E039 Hypothyroidism, unspecified: Secondary | ICD-10-CM | POA: Diagnosis not present

## 2024-08-21 DIAGNOSIS — M8580 Other specified disorders of bone density and structure, unspecified site: Secondary | ICD-10-CM | POA: Diagnosis not present

## 2024-08-26 ENCOUNTER — Other Ambulatory Visit: Payer: Self-pay

## 2024-08-26 ENCOUNTER — Other Ambulatory Visit (HOSPITAL_COMMUNITY): Payer: Self-pay

## 2024-08-26 MED ORDER — LISDEXAMFETAMINE DIMESYLATE 60 MG PO CHEW: 60.0000 mg | CHEWABLE_TABLET | Freq: Every morning | ORAL | 0 refills | Status: AC

## 2024-08-26 MED ORDER — LEVOTHYROXINE SODIUM 137 MCG PO TABS
137.0000 ug | ORAL_TABLET | Freq: Every morning | ORAL | 0 refills | Status: DC
  Filled 2024-08-26: qty 30, 30d supply, fill #0

## 2024-08-26 MED ORDER — LISDEXAMFETAMINE DIMESYLATE 60 MG PO CHEW
60.0000 mg | CHEWABLE_TABLET | Freq: Every morning | ORAL | 0 refills | Status: AC
  Filled 2024-08-26: qty 30, 30d supply, fill #0

## 2024-09-24 ENCOUNTER — Encounter (HOSPITAL_COMMUNITY): Payer: Self-pay

## 2024-09-24 ENCOUNTER — Other Ambulatory Visit (HOSPITAL_COMMUNITY): Payer: Self-pay

## 2024-09-24 MED ORDER — LEVOTHYROXINE SODIUM 137 MCG PO TABS
137.0000 ug | ORAL_TABLET | Freq: Every morning | ORAL | 1 refills | Status: AC
  Filled 2024-09-24: qty 90, 90d supply, fill #0

## 2024-09-25 ENCOUNTER — Other Ambulatory Visit (HOSPITAL_COMMUNITY): Payer: Self-pay

## 2024-09-25 MED ORDER — LISDEXAMFETAMINE DIMESYLATE 60 MG PO CHEW
60.0000 mg | CHEWABLE_TABLET | Freq: Every morning | ORAL | 0 refills | Status: AC
  Filled 2024-09-25: qty 30, 30d supply, fill #0

## 2024-09-28 ENCOUNTER — Other Ambulatory Visit (HOSPITAL_COMMUNITY): Payer: Self-pay

## 2024-09-29 ENCOUNTER — Other Ambulatory Visit (HOSPITAL_COMMUNITY): Payer: Self-pay
